# Patient Record
Sex: Male | Born: 1965 | Race: White | Hispanic: No | Marital: Single | Smoking: Never smoker
Health system: Southern US, Community
[De-identification: ages and names within clinical notes are randomized; demographics above are authoritative.]

## PROBLEM LIST (undated history)

## (undated) DIAGNOSIS — I639 Cerebral infarction, unspecified: Secondary | ICD-10-CM

## (undated) DIAGNOSIS — I272 Pulmonary hypertension, unspecified: Secondary | ICD-10-CM

## (undated) DIAGNOSIS — I509 Heart failure, unspecified: Secondary | ICD-10-CM

## (undated) DIAGNOSIS — M109 Gout, unspecified: Secondary | ICD-10-CM

## (undated) DIAGNOSIS — N183 Chronic kidney disease, stage 3 unspecified: Secondary | ICD-10-CM

## (undated) DIAGNOSIS — I129 Hypertensive chronic kidney disease with stage 1 through stage 4 chronic kidney disease, or unspecified chronic kidney disease: Secondary | ICD-10-CM

## (undated) DIAGNOSIS — M1990 Unspecified osteoarthritis, unspecified site: Secondary | ICD-10-CM

## (undated) DIAGNOSIS — I82409 Acute embolism and thrombosis of unspecified deep veins of unspecified lower extremity: Secondary | ICD-10-CM

## (undated) DIAGNOSIS — E669 Obesity, unspecified: Secondary | ICD-10-CM

## (undated) DIAGNOSIS — I2510 Atherosclerotic heart disease of native coronary artery without angina pectoris: Secondary | ICD-10-CM

## (undated) DIAGNOSIS — K219 Gastro-esophageal reflux disease without esophagitis: Secondary | ICD-10-CM

## (undated) DIAGNOSIS — M549 Dorsalgia, unspecified: Secondary | ICD-10-CM

## (undated) DIAGNOSIS — I43 Cardiomyopathy in diseases classified elsewhere: Secondary | ICD-10-CM

## (undated) DIAGNOSIS — E785 Hyperlipidemia, unspecified: Secondary | ICD-10-CM

## (undated) DIAGNOSIS — I119 Hypertensive heart disease without heart failure: Secondary | ICD-10-CM

## (undated) DIAGNOSIS — E119 Type 2 diabetes mellitus without complications: Secondary | ICD-10-CM

## (undated) HISTORY — DX: Obesity, unspecified: E66.9

## (undated) HISTORY — DX: Type 2 diabetes mellitus without complications: E11.9

## (undated) HISTORY — DX: Acute embolism and thrombosis of unspecified deep veins of unspecified lower extremity: I82.409

## (undated) HISTORY — PX: OTHER SURGICAL HISTORY: SHX169

## (undated) HISTORY — DX: Gastro-esophageal reflux disease without esophagitis: K21.9

## (undated) HISTORY — DX: Hypertensive chronic kidney disease with stage 1 through stage 4 chronic kidney disease, or unspecified chronic kidney disease: I12.9

## (undated) HISTORY — PX: MENISCECTOMY: SHX123

## (undated) HISTORY — DX: Cardiomyopathy in diseases classified elsewhere: I43

## (undated) HISTORY — DX: Heart failure, unspecified: I50.9

## (undated) HISTORY — PX: HERNIA REPAIR: SHX51

## (undated) HISTORY — DX: Hyperlipidemia, unspecified: E78.5

## (undated) HISTORY — DX: Atherosclerotic heart disease of native coronary artery without angina pectoris: I25.10

## (undated) HISTORY — DX: Chronic kidney disease, stage 3 (moderate): N18.3

## (undated) HISTORY — DX: Pulmonary hypertension, unspecified: I27.20

## (undated) HISTORY — DX: Chronic kidney disease, stage 3 unspecified: N18.30

## (undated) HISTORY — DX: Dorsalgia, unspecified: M54.9

## (undated) HISTORY — PX: CORONARY STENT PLACEMENT: SHX1402

## (undated) HISTORY — DX: Gout, unspecified: M10.9

## (undated) HISTORY — DX: Unspecified osteoarthritis, unspecified site: M19.90

## (undated) HISTORY — DX: Cerebral infarction, unspecified: I63.9

## (undated) HISTORY — DX: Hypertensive heart disease without heart failure: I11.9

---

## 2015-07-13 LAB — HEPATIC FUNCTION PANEL
ALT: 16 U/L (ref 10–40)
AST: 18 U/L (ref 14–40)
Alkaline Phosphatase: 93 U/L (ref 25–125)
BILIRUBIN, TOTAL: 0.2 mg/dL

## 2015-07-13 LAB — BASIC METABOLIC PANEL
BUN: 33 mg/dL — AB (ref 4–21)
CREATININE: 1.9 mg/dL — AB (ref 0.6–1.3)
Glucose: 153 mg/dL
POTASSIUM: 3.8 mmol/L (ref 3.4–5.3)
Sodium: 143 mmol/L (ref 137–147)

## 2015-07-13 LAB — CBC AND DIFFERENTIAL
HCT: 38 % — AB (ref 41–53)
HEMOGLOBIN: 11.9 g/dL — AB (ref 13.5–17.5)
Platelets: 371 10*3/uL (ref 150–399)
WBC: 8.3 10*3/mL

## 2015-07-13 LAB — POCT INR: INR: 1 (ref 0.9–1.1)

## 2015-07-13 LAB — PROTIME-INR: Protime: 10.5 seconds (ref 10.0–13.8)

## 2015-07-14 LAB — HEMOGLOBIN A1C: HEMOGLOBIN A1C: 6.6

## 2015-07-17 LAB — BASIC METABOLIC PANEL
BUN: 25 mg/dL — AB (ref 4–21)
Creatinine: 1.8 mg/dL — AB (ref 0.6–1.3)
POTASSIUM: 4 mmol/L (ref 3.4–5.3)
Sodium: 138 mmol/L (ref 137–147)

## 2015-07-17 LAB — CBC AND DIFFERENTIAL
HEMATOCRIT: 33 % — AB (ref 41–53)
HEMOGLOBIN: 10.8 g/dL — AB (ref 13.5–17.5)
PLATELETS: 434 10*3/uL — AB (ref 150–399)
WBC: 10.1 10^3/mL

## 2015-07-21 ENCOUNTER — Encounter: Payer: Self-pay | Admitting: Internal Medicine

## 2015-07-21 ENCOUNTER — Non-Acute Institutional Stay: Payer: BLUE CROSS/BLUE SHIELD | Admitting: Internal Medicine

## 2015-07-21 DIAGNOSIS — E785 Hyperlipidemia, unspecified: Secondary | ICD-10-CM

## 2015-07-21 DIAGNOSIS — E1122 Type 2 diabetes mellitus with diabetic chronic kidney disease: Secondary | ICD-10-CM | POA: Diagnosis not present

## 2015-07-21 DIAGNOSIS — I6340 Cerebral infarction due to embolism of unspecified cerebral artery: Secondary | ICD-10-CM | POA: Diagnosis not present

## 2015-07-21 DIAGNOSIS — I110 Hypertensive heart disease with heart failure: Secondary | ICD-10-CM | POA: Diagnosis not present

## 2015-07-21 DIAGNOSIS — I825Y9 Chronic embolism and thrombosis of unspecified deep veins of unspecified proximal lower extremity: Secondary | ICD-10-CM | POA: Diagnosis not present

## 2015-07-21 DIAGNOSIS — M1A49X0 Other secondary chronic gout, multiple sites, without tophus (tophi): Secondary | ICD-10-CM

## 2015-07-21 DIAGNOSIS — M1049 Other secondary gout, multiple sites: Secondary | ICD-10-CM

## 2015-07-21 DIAGNOSIS — I43 Cardiomyopathy in diseases classified elsewhere: Secondary | ICD-10-CM

## 2015-07-21 DIAGNOSIS — I129 Hypertensive chronic kidney disease with stage 1 through stage 4 chronic kidney disease, or unspecified chronic kidney disease: Secondary | ICD-10-CM

## 2015-07-21 DIAGNOSIS — I272 Other secondary pulmonary hypertension: Secondary | ICD-10-CM

## 2015-07-21 DIAGNOSIS — K921 Melena: Secondary | ICD-10-CM

## 2015-07-21 DIAGNOSIS — N183 Chronic kidney disease, stage 3 unspecified: Secondary | ICD-10-CM

## 2015-07-21 NOTE — Progress Notes (Signed)
Patient ID: Jimmy Tran Pfahler, male   DOB: 10/29/1965, 50 y.o.   MRN: [id number] MRN: [id number] Name: Jimmy Tran Stumpp  Sex: male Age: 50 y.o. DOB: 03/04/1966  [facility] Facility/Room:119 Level Of Care: SNF Provider: Merrilee SeashoreAnne Giulia Hickey Emergency Contacts: Extended Emergency Contact Information Primary Emergency Contact: Aloha Gellurner,Jerry  [place] Home Phone: [phone] Relation: Brother Secondary Emergency Contact: Dorris FetchScott,Rhonda  [place] Home Phone: [phone] Relation: Sister  Code Status:   Allergies: Beef allergy; Fish-derived products; Milk-related compounds; Pork-derived products; Shellfish-derived products; and Shrimp  Chief Complaint  Patient presents with  . New Admit To SNF    Admission to Nursing Facility    HPI: Patient is 50 y.o. male who prior CVA with R side weakness with high risk social circumstances who presented to ED with blood in stool. He was [facility] basically for SNF placement but but his non compliance with medical tx presenting hazards to his health were addressed including but not limited to blood in stool and cardiomyopathy and pulmonary HTN as outlined below.Pt is admitted to SNF for residential care. While at SNF pt will ne followed for HTN, tx with norvasc, coreg and lisinopril, DM, tx with amaryl and tradjenta and HLD, tx with lipitor.  Past Medical History  Diagnosis Date  . Arthritis   . Back pain   . CHF (congestive heart failure) (HCC)   . Coronary artery disease     2 stents 2004  . Gout   . Hyperlipemia   . DVT (deep venous thrombosis) (HCC)   . CVA (cerebral infarction)     Residual Right Hemiparesis  . DM type 2 (diabetes mellitus, type 2) (HCC)   . Cardiomyopathy due to hypertension (HCC)   . GERD (gastroesophageal reflux disease)   . Pulmonary hypertension (HCC)   . Obesity, Class I, BMI 30.0-34.9 (see actual BMI)   . Benign hypertension with CKD (chronic kidney disease) stage III     Past  Surgical History  Procedure Laterality Date  . Coronary stent placement      2004  . Hernia repair    . Left knee surgery    . Meniscectomy Left     2004      Medication List       This list is accurate as of: 07/21/15 11:59 PM.  Always use your most recent med list.               allopurinol 300 MG tablet  Commonly known as:  ZYLOPRIM  Take 300 mg by mouth daily.     amLODipine 10 MG tablet  Commonly known as:  NORVASC  Take 10 mg by mouth daily.     apixaban 5 MG Tabs tablet  Commonly known as:  ELIQUIS  Take 5 mg by mouth 2 (two) times daily.     atorvastatin 20 MG tablet  Commonly known as:  LIPITOR  Take 20 mg by mouth daily.     carvedilol 25 MG tablet  Commonly known as:  COREG  Take 12.5 mg by mouth 2 (two) times daily with a meal.     chlorthalidone 25 MG tablet  Commonly known as:  HYGROTON  Take 25 mg by mouth daily.     colchicine 0.6 MG tablet  Take 0.6 mg by mouth every other day.     furosemide 20 MG tablet  Commonly known as:  LASIX  Take 20 mg by mouth.     glimepiride 1 MG tablet  Commonly  known as:  AMARYL  Take 1 mg by mouth daily with breakfast.     lisinopril 20 MG tablet  Commonly known as:  PRINIVIL,ZESTRIL  Take 20 mg by mouth daily.     mirtazapine 15 MG tablet  Commonly known as:  REMERON  Take 15 mg by mouth at bedtime.     multivitamin tablet  Take 1 tablet by mouth daily.     TRADJENTA 5 MG Tabs tablet  Generic drug:  linagliptin  Take 5 mg by mouth daily with breakfast.        Meds ordered this encounter  Medications  . allopurinol (ZYLOPRIM) 300 MG tablet    Sig: Take 300 mg by mouth daily.  Marland Kitchen amLODipine (NORVASC) 10 MG tablet    Sig: Take 10 mg by mouth daily.  Marland Kitchen apixaban (ELIQUIS) 5 MG TABS tablet    Sig: Take 5 mg by mouth 2 (two) times daily.  . chlorthalidone (HYGROTON) 25 MG tablet    Sig: Take 25 mg by mouth daily.  . colchicine 0.6 MG tablet    Sig: Take 0.6 mg by mouth every other day.  Marland Kitchen  glimepiride (AMARYL) 1 MG tablet    Sig: Take 1 mg by mouth daily with breakfast.  . lisinopril (PRINIVIL,ZESTRIL) 20 MG tablet    Sig: Take 20 mg by mouth daily.  . mirtazapine (REMERON) 15 MG tablet    Sig: Take 15 mg by mouth at bedtime.  . Multiple Vitamin (MULTIVITAMIN) tablet    Sig: Take 1 tablet by mouth daily.  Marland Kitchen atorvastatin (LIPITOR) 20 MG tablet    Sig: Take 20 mg by mouth daily.  . carvedilol (COREG) 25 MG tablet    Sig: Take 12.5 mg by mouth 2 (two) times daily with a meal.  . furosemide (LASIX) 20 MG tablet    Sig: Take 20 mg by mouth.  . linagliptin (TRADJENTA) 5 MG TABS tablet    Sig: Take 5 mg by mouth daily with breakfast.     There is no immunization history on file for this patient.  Social History  Substance Use Topics  . Smoking status: Never Smoker   . Smokeless tobacco: Never Used  . Alcohol Use: Not on file    Family history is + HIV, DM, HTN  Review of Systems  DATA OBTAINED: from patient, nurse GENERAL:  no fevers, fatigue, appetite changes SKIN: No itching, rash or wounds EYES: No eye pain, redness, discharge EARS: No earache, tinnitus, change in hearing NOSE: No congestion, drainage or bleeding  MOUTH/THROAT: No mouth or tooth pain, No sore throat RESPIRATORY: No cough, wheezing, SOB CARDIAC: No chest pain, palpitations, lower extremity edema  GI: No abdominal pain, No N/V/D or constipation, No heartburn or reflux  GU: No dysuria, frequency or urgency, or incontinence  MUSCULOSKELETAL: No unrelieved bone/joint pain NEUROLOGIC: No headache, dizziness or focal weakness PSYCHIATRIC: No c/o anxiety or sadness   Filed Vitals:   07/21/15 1156  BP: 136/90  Pulse: 95  Temp: 96.5 F (35.8 C)  Resp: 20    SpO2 Readings from Last 1 Encounters:  07/21/15 99%        Physical Exam  GENERAL APPEARANCE: Alert, conversant,  No acute distress.  SKIN: No diaphoresis rash HEAD: Normocephalic, atraumatic  EYES: Conjunctiva/lids clear.  Pupils round, reactive. EOMs intact.  EARS: External exam WNL, canals clear. Hearing grossly normal.  NOSE: No deformity or discharge.  MOUTH/THROAT: Lips w/o lesions  RESPIRATORY: Breathing is even, unlabored. Lung sounds are  clear   CARDIOVASCULAR: Heart RRR no murmurs, rubs or gallops. No peripheral edema.   GASTROINTESTINAL: Abdomen is soft, non-tender, not distended w/ normal bowel sounds. GENITOURINARY: Bladder non tender, not distended  MUSCULOSKELETAL: No abnormal joints or musculature NEUROLOGIC:  Cranial nerves 2-12 grossly intact, R hemiparesis PSYCHIATRIC: Mood and affect appropriate to situation, no behavioral issues  Patient Active Problem List   Diagnosis Date Noted  . DM type 2 (diabetes mellitus, type 2) (HCC) 08/10/2015  . Hyperlipemia 08/10/2015  . DVT (deep venous thrombosis) (HCC) 08/10/2015  . Benign hypertension with CKD (chronic kidney disease) stage III 08/10/2015  . Pulmonary hypertension (HCC) 08/10/2015  . Gout 08/10/2015  . Blood in stool 08/07/2015  . CVA (cerebral infarction) 08/07/2015  . Cardiomyopathy due to hypertension (HCC) 08/07/2015  . Hypertensive heart disease with CHF (congestive heart failure) (HCC) 08/07/2015    CBC    Component Value Date/Time   WBC 10.1 07/17/2015   HGB 10.8* 07/17/2015   HCT 33* 07/17/2015   PLT 434* 07/17/2015    CMP     Component Value Date/Time   NA 138 07/17/2015   K 4.0 07/17/2015   BUN 25* 07/17/2015   CREATININE 1.8* 07/17/2015   AST 18 07/13/2015   ALT 16 07/13/2015   ALKPHOS 93 07/13/2015    Lab Results  Component Value Date   HGBA1C 6.6 07/14/2015     Patient was never admitted.  Not all labs, radiology exams or other studies done during hospitalization come through on my EPIC note; however they are reviewed by me.    Assessment and Plan  Blood in stool Felt to be from ASA alone or ASA and eliquis combned; ASA was stopped and Hb remained stable and 10.1 ar d/c; SNF - will f/u  CBC  CVA (cerebral infarction) SNF - admitted for OT/PT   Cardiomyopathy due to hypertension (HCC) SNF - h/o non compliance; pt restarted on coreg, lasix and lisinopril  Hypertensive heart disease with CHF (congestive heart failure) (HCC) SNF - controlled ; cont norvasc 10 mg, lisinopril 10 mg, lopressor 25 mg BID  DM type 2 (diabetes mellitus, type 2) (HCC) SNF - A1c was 6.6; good control; cont amaryl 1 mg and tradjenta 5 mg daily  Hyperlipemia Not stated as uncontrolled; cont lipitor  DVT (deep venous thrombosis) (HCC) SNF - with blood in stool, ASA was d/c but eliquis 5 mg BID was continued  Benign hypertension with CKD (chronic kidney disease) stage III SNF - Cr at baseline at around 2; will f/i BMP  Pulmonary hypertension (HCC) SNF - not stated as unstable;cont lasix and coreg  Gout SNF - not stated as uncontrolled ;cont allopurinol and colchicine   Time spent > 45 min;> 50% of time with patient was spent reviewing records, labs, tests and studies, counseling and developing plan of care  Merrilee Seashore, MD

## 2015-07-21 NOTE — Progress Notes (Signed)
This encounter was created in error - please disregard.

## 2015-08-07 ENCOUNTER — Encounter: Payer: Self-pay | Admitting: Internal Medicine

## 2015-08-07 DIAGNOSIS — K921 Melena: Secondary | ICD-10-CM | POA: Insufficient documentation

## 2015-08-07 DIAGNOSIS — I639 Cerebral infarction, unspecified: Secondary | ICD-10-CM | POA: Insufficient documentation

## 2015-08-07 DIAGNOSIS — I43 Cardiomyopathy in diseases classified elsewhere: Secondary | ICD-10-CM

## 2015-08-07 DIAGNOSIS — I110 Hypertensive heart disease with heart failure: Secondary | ICD-10-CM | POA: Insufficient documentation

## 2015-08-07 DIAGNOSIS — I119 Hypertensive heart disease without heart failure: Secondary | ICD-10-CM | POA: Insufficient documentation

## 2015-08-07 NOTE — Progress Notes (Signed)
This encounter was created in error - please disregard.

## 2015-08-07 NOTE — Assessment & Plan Note (Signed)
Felt to be from ASA alone or ASA and eliquis combned; ASA was stopped and Hb remained stable and 10.1 ar d/c; SNF - will f/u CBC

## 2015-08-07 NOTE — Assessment & Plan Note (Signed)
SNF - h/o non compliance; pt restarted on coreg, lasix and lisinopril

## 2015-08-07 NOTE — Assessment & Plan Note (Signed)
SNF - admitted for OT/PT 

## 2015-08-07 NOTE — Assessment & Plan Note (Signed)
SNF - controlled ; cont norvasc 10 mg, lisinopril 10 mg, lopressor 25 mg BID

## 2015-08-10 DIAGNOSIS — I129 Hypertensive chronic kidney disease with stage 1 through stage 4 chronic kidney disease, or unspecified chronic kidney disease: Secondary | ICD-10-CM | POA: Insufficient documentation

## 2015-08-10 DIAGNOSIS — N183 Chronic kidney disease, stage 3 (moderate): Secondary | ICD-10-CM

## 2015-08-10 DIAGNOSIS — I272 Pulmonary hypertension, unspecified: Secondary | ICD-10-CM | POA: Insufficient documentation

## 2015-08-10 DIAGNOSIS — I82409 Acute embolism and thrombosis of unspecified deep veins of unspecified lower extremity: Secondary | ICD-10-CM | POA: Insufficient documentation

## 2015-08-10 DIAGNOSIS — M109 Gout, unspecified: Secondary | ICD-10-CM | POA: Insufficient documentation

## 2015-08-10 DIAGNOSIS — E785 Hyperlipidemia, unspecified: Secondary | ICD-10-CM | POA: Insufficient documentation

## 2015-08-10 DIAGNOSIS — E119 Type 2 diabetes mellitus without complications: Secondary | ICD-10-CM | POA: Insufficient documentation

## 2015-08-10 NOTE — Assessment & Plan Note (Signed)
SNF - not stated as uncontrolled ;cont allopurinol and colchicine

## 2015-08-10 NOTE — Assessment & Plan Note (Signed)
SNF - Cr at baseline at around 2; will f/i BMP

## 2015-08-10 NOTE — Assessment & Plan Note (Signed)
SNF - not stated as unstable;cont lasix and coreg

## 2015-08-10 NOTE — Assessment & Plan Note (Signed)
SNF - with blood in stool, ASA was d/c but eliquis 5 mg BID was continued

## 2015-08-10 NOTE — Assessment & Plan Note (Signed)
Not stated as uncontrolled; cont lipitor

## 2015-08-10 NOTE — Assessment & Plan Note (Signed)
SNF - A1c was 6.6; good control; cont amaryl 1 mg and tradjenta 5 mg daily

## 2015-08-14 ENCOUNTER — Encounter: Payer: Self-pay | Admitting: Internal Medicine

## 2015-08-14 ENCOUNTER — Non-Acute Institutional Stay: Payer: BLUE CROSS/BLUE SHIELD | Admitting: Internal Medicine

## 2015-08-14 DIAGNOSIS — E1122 Type 2 diabetes mellitus with diabetic chronic kidney disease: Secondary | ICD-10-CM

## 2015-08-14 DIAGNOSIS — I825Y9 Chronic embolism and thrombosis of unspecified deep veins of unspecified proximal lower extremity: Secondary | ICD-10-CM

## 2015-08-14 DIAGNOSIS — E785 Hyperlipidemia, unspecified: Secondary | ICD-10-CM | POA: Diagnosis not present

## 2015-08-14 DIAGNOSIS — M1049 Other secondary gout, multiple sites: Secondary | ICD-10-CM

## 2015-08-14 DIAGNOSIS — I6340 Cerebral infarction due to embolism of unspecified cerebral artery: Secondary | ICD-10-CM

## 2015-08-14 DIAGNOSIS — M1A49X0 Other secondary chronic gout, multiple sites, without tophus (tophi): Secondary | ICD-10-CM

## 2015-08-14 DIAGNOSIS — I272 Other secondary pulmonary hypertension: Secondary | ICD-10-CM

## 2015-08-14 DIAGNOSIS — I110 Hypertensive heart disease with heart failure: Secondary | ICD-10-CM | POA: Diagnosis not present

## 2015-08-14 DIAGNOSIS — N183 Chronic kidney disease, stage 3 unspecified: Secondary | ICD-10-CM

## 2015-08-14 DIAGNOSIS — K921 Melena: Secondary | ICD-10-CM | POA: Diagnosis not present

## 2015-08-14 DIAGNOSIS — I129 Hypertensive chronic kidney disease with stage 1 through stage 4 chronic kidney disease, or unspecified chronic kidney disease: Secondary | ICD-10-CM | POA: Diagnosis not present

## 2015-08-14 DIAGNOSIS — I43 Cardiomyopathy in diseases classified elsewhere: Secondary | ICD-10-CM

## 2015-08-14 NOTE — Progress Notes (Signed)
MRN: [id number] Name: Jimmy Tran  Sex: male Age: 50 y.o. DOB: 02/15/1966  [facility] Facility/Room:126 Level Of Care: SNF Provider: Merrilee SeashoreALEXANDER, ANNE D Emergency Contacts: Extended Emergency Contact Information Primary Emergency Contact: Aloha Gellurner,Jerry  [place] Home Phone: [phone] Relation: Brother Secondary Emergency Contact: Dorris FetchScott,Rhonda  [place] Home Phone: [phone] Relation: Sister  Code Status:   Allergies: Beef allergy; Fish-derived products; Milk-related compounds; Pork-derived products; Shellfish-derived products; and Shrimp  Chief Complaint  Patient presents with  . Discharge Note    HPI: Patient is 50 y.o. male with prior CVA with R side weakness with high risk social circumstances who presented to ED with blood in stool. [facility] was [facility] basically for SNF placement but but his non compliance with medical tx presenting hazards to his health were addressed including but not limited to blood in stool and cardiomyopathy and pulmonary HTN as outlined below.Pt was admitted to SNF for residential care. Pt is now being transferred to the [facility].  Past Medical History  Diagnosis Date  . Arthritis   . Back pain   . CHF (congestive heart failure) (HCC)   . Coronary artery disease     2 stents 2004  . Gout   . Hyperlipemia   . DVT (deep venous thrombosis) (HCC)   . CVA (cerebral infarction)     Residual Right Hemiparesis  . DM type 2 (diabetes mellitus, type 2) (HCC)   . Cardiomyopathy due to hypertension (HCC)   . GERD (gastroesophageal reflux disease)   . Pulmonary hypertension (HCC)   . Obesity, Class I, BMI 30.0-34.9 (see actual BMI)   . Benign hypertension with CKD (chronic kidney disease) stage III     Past Surgical History  Procedure Laterality Date  . Coronary stent placement      2004  . Hernia repair    . Left knee surgery    . Meniscectomy Left     2004      Medication List       This  list is accurate as of: 08/14/15  4:22 PM.  Always use your most recent med list.               allopurinol 300 MG tablet  Commonly known as:  ZYLOPRIM  Take 300 mg by mouth daily.     amLODipine 10 MG tablet  Commonly known as:  NORVASC  Take 10 mg by mouth daily.     apixaban 5 MG Tabs tablet  Commonly known as:  ELIQUIS  Take 5 mg by mouth 2 (two) times daily.     atorvastatin 20 MG tablet  Commonly known as:  LIPITOR  Take 20 mg by mouth daily.     carvedilol 25 MG tablet  Commonly known as:  COREG  Take 12.5 mg by mouth 2 (two) times daily with a meal.     colchicine 0.6 MG tablet  Take 0.6 mg by mouth every other day.     furosemide 20 MG tablet  Commonly known as:  LASIX  Take 20 mg by mouth.     glimepiride 1 MG tablet  Commonly known as:  AMARYL  Take 1 mg by mouth daily with breakfast.     lisinopril 20 MG tablet  Commonly known as:  PRINIVIL,ZESTRIL  Take 20 mg by mouth daily.     mirtazapine 15 MG tablet  Commonly known as:  REMERON  Take 15 mg by mouth at bedtime.  multivitamin tablet  Take 1 tablet by mouth daily.     TRADJENTA 5 MG Tabs tablet  Generic drug:  linagliptin  Take 5 mg by mouth daily with breakfast.        No orders of the defined types were placed in this encounter.     There is no immunization history on file for this patient.  Social History  Substance Use Topics  . Smoking status: Never Smoker   . Smokeless tobacco: Never Used  . Alcohol Use: Not on file    Filed Vitals:   08/14/15 1559  BP: 136/90  Pulse: 95  Temp: 96.5 F (35.8 C)  Resp: 20    Physical Exam  GENERAL APPEARANCE: Alert, conversant. No acute distress.  HEENT: Unremarkable. RESPIRATORY: Breathing is even, unlabored. Lung sounds are clear   CARDIOVASCULAR: Heart RRR no murmurs, rubs or gallops. No peripheral edema.  GASTROINTESTINAL: Abdomen is soft, non-tender, not distended w/ normal bowel sounds.  NEUROLOGIC: Cranial nerves 2-12  grossly intact; R hemiparesis  Patient Active Problem List   Diagnosis Date Noted  . DM type 2 (diabetes mellitus, type 2) (HCC) 08/10/2015  . Hyperlipemia 08/10/2015  . DVT (deep venous thrombosis) (HCC) 08/10/2015  . Benign hypertension with CKD (chronic kidney disease) stage III 08/10/2015  . Pulmonary hypertension (HCC) 08/10/2015  . Gout 08/10/2015  . Blood in stool 08/07/2015  . CVA (cerebral infarction) 08/07/2015  . Cardiomyopathy due to hypertension (HCC) 08/07/2015  . Hypertensive heart disease with CHF (congestive heart failure) (HCC) 08/07/2015    CBC    Component Value Date/Time   WBC 10.1 07/17/2015   HGB 10.8* 07/17/2015   HCT 33* 07/17/2015   PLT 434* 07/17/2015    CMP     Component Value Date/Time   NA 138 07/17/2015   K 4.0 07/17/2015   BUN 25* 07/17/2015   CREATININE 1.8* 07/17/2015   AST 18 07/13/2015   ALT 16 07/13/2015   ALKPHOS 93 07/13/2015    Assessment and Plan  Pt is being transferred to the [place] center. Medications have been reconciled.   Time spent > 45 min;> 50% of time with patient was spent reviewing records, labs, tests and studies, counseling and developing plan of care  Margit Hanks, MD

## 2017-02-25 ENCOUNTER — Inpatient Hospital Stay
Admission: AD | Admit: 2017-02-25 | Discharge: 2017-03-30 | Disposition: A | Discharge status: Skilled Nursing Facility | Payer: Self-pay | Source: Ambulatory Visit | Attending: Internal Medicine | Admitting: Internal Medicine

## 2017-02-25 ENCOUNTER — Other Ambulatory Visit (HOSPITAL_COMMUNITY): Payer: Self-pay

## 2017-02-25 DIAGNOSIS — R509 Fever, unspecified: Secondary | ICD-10-CM

## 2017-02-25 DIAGNOSIS — Z452 Encounter for adjustment and management of vascular access device: Secondary | ICD-10-CM

## 2017-02-25 DIAGNOSIS — Z0189 Encounter for other specified special examinations: Secondary | ICD-10-CM

## 2017-02-25 DIAGNOSIS — Z431 Encounter for attention to gastrostomy: Secondary | ICD-10-CM

## 2017-02-25 DIAGNOSIS — N179 Acute kidney failure, unspecified: Secondary | ICD-10-CM

## 2017-02-25 DIAGNOSIS — Z4659 Encounter for fitting and adjustment of other gastrointestinal appliance and device: Secondary | ICD-10-CM

## 2017-02-25 DIAGNOSIS — Z931 Gastrostomy status: Secondary | ICD-10-CM

## 2017-02-26 ENCOUNTER — Other Ambulatory Visit (HOSPITAL_COMMUNITY): Payer: Self-pay

## 2017-02-26 LAB — URINALYSIS, ROUTINE W REFLEX MICROSCOPIC
Bilirubin Urine: NEGATIVE
Glucose, UA: NEGATIVE mg/dL
Hgb urine dipstick: NEGATIVE
KETONES UR: NEGATIVE mg/dL
NITRITE: POSITIVE — AB
PH: 5 (ref 5.0–8.0)
Protein, ur: 30 mg/dL — AB
Specific Gravity, Urine: 1.01 (ref 1.005–1.030)
Squamous Epithelial / LPF: NONE SEEN

## 2017-02-26 LAB — COMPREHENSIVE METABOLIC PANEL
ALBUMIN: 1.5 g/dL — AB (ref 3.5–5.0)
ALK PHOS: 200 U/L — AB (ref 38–126)
ALT: 11 U/L — AB (ref 17–63)
ANION GAP: 9 (ref 5–15)
AST: 14 U/L — ABNORMAL LOW (ref 15–41)
BILIRUBIN TOTAL: 0.8 mg/dL (ref 0.3–1.2)
BUN: 21 mg/dL — ABNORMAL HIGH (ref 6–20)
CO2: 22 mmol/L (ref 22–32)
Calcium: 8.2 mg/dL — ABNORMAL LOW (ref 8.9–10.3)
Chloride: 104 mmol/L (ref 101–111)
Creatinine, Ser: 2.58 mg/dL — ABNORMAL HIGH (ref 0.61–1.24)
GFR calc Af Amer: 32 mL/min — ABNORMAL LOW (ref >60)
GFR calc non Af Amer: 27 mL/min — ABNORMAL LOW (ref >60)
Glucose, Bld: 101 mg/dL — ABNORMAL HIGH (ref 65–99)
Potassium: 5.9 mmol/L — ABNORMAL HIGH (ref 3.5–5.1)
SODIUM: 135 mmol/L (ref 135–145)
Total Protein: 6.6 g/dL (ref 6.5–8.1)

## 2017-02-26 LAB — CBC WITH DIFFERENTIAL/PLATELET
BASOS ABS: 0 10*3/uL (ref 0.0–0.1)
BASOS PCT: 0 %
EOS ABS: 0.3 10*3/uL (ref 0.0–0.7)
EOS PCT: 3 %
HCT: 24.7 % — ABNORMAL LOW (ref 39.0–52.0)
Hemoglobin: 7.5 g/dL — ABNORMAL LOW (ref 13.0–17.0)
LYMPHS ABS: 3.2 10*3/uL (ref 0.7–4.0)
Lymphocytes Relative: 26 %
MCH: 27.8 pg (ref 26.0–34.0)
MCHC: 30.4 g/dL (ref 30.0–36.0)
MCV: 91.5 fL (ref 78.0–100.0)
Monocytes Absolute: 1.2 10*3/uL — ABNORMAL HIGH (ref 0.1–1.0)
Monocytes Relative: 9 %
Neutro Abs: 7.9 10*3/uL — ABNORMAL HIGH (ref 1.7–7.7)
Neutrophils Relative %: 62 %
PLATELETS: 726 10*3/uL — AB (ref 150–400)
RBC: 2.7 MIL/uL — AB (ref 4.22–5.81)
RDW: 14.8 % (ref 11.5–15.5)
WBC: 12.6 10*3/uL — AB (ref 4.0–10.5)

## 2017-02-26 LAB — HEMOGLOBIN A1C
HEMOGLOBIN A1C: 5.4 % (ref 4.8–5.6)
MEAN PLASMA GLUCOSE: 108.28 mg/dL

## 2017-02-26 LAB — PHOSPHORUS: PHOSPHORUS: 5.9 mg/dL — AB (ref 2.5–4.6)

## 2017-02-26 LAB — MAGNESIUM: Magnesium: 1.6 mg/dL — ABNORMAL LOW (ref 1.7–2.4)

## 2017-02-26 LAB — CK: Total CK: 20 U/L — ABNORMAL LOW (ref 49–397)

## 2017-02-26 LAB — PROTIME-INR
INR: 2.3
PROTHROMBIN TIME: 25.1 s — AB (ref 11.4–15.2)

## 2017-02-26 LAB — TSH: TSH: 0.943 u[IU]/mL (ref 0.350–4.500)

## 2017-02-27 LAB — RENAL FUNCTION PANEL
ANION GAP: 6 (ref 5–15)
Albumin: 1.5 g/dL — ABNORMAL LOW (ref 3.5–5.0)
BUN: 24 mg/dL — ABNORMAL HIGH (ref 6–20)
CALCIUM: 8 mg/dL — AB (ref 8.9–10.3)
CHLORIDE: 109 mmol/L (ref 101–111)
CO2: 21 mmol/L — AB (ref 22–32)
Creatinine, Ser: 2.78 mg/dL — ABNORMAL HIGH (ref 0.61–1.24)
GFR calc Af Amer: 29 mL/min — ABNORMAL LOW (ref >60)
GFR calc non Af Amer: 25 mL/min — ABNORMAL LOW (ref >60)
GLUCOSE: 108 mg/dL — AB (ref 65–99)
Phosphorus: 5.9 mg/dL — ABNORMAL HIGH (ref 2.5–4.6)
Potassium: 5 mmol/L (ref 3.5–5.1)
SODIUM: 136 mmol/L (ref 135–145)

## 2017-02-27 LAB — CBC
HCT: 24.3 % — ABNORMAL LOW (ref 39.0–52.0)
HEMOGLOBIN: 7.5 g/dL — AB (ref 13.0–17.0)
MCH: 28.1 pg (ref 26.0–34.0)
MCHC: 30.9 g/dL (ref 30.0–36.0)
MCV: 91 fL (ref 78.0–100.0)
Platelets: 695 10*3/uL — ABNORMAL HIGH (ref 150–400)
RBC: 2.67 MIL/uL — ABNORMAL LOW (ref 4.22–5.81)
RDW: 14.7 % (ref 11.5–15.5)
WBC: 9.5 10*3/uL (ref 4.0–10.5)

## 2017-02-27 LAB — URINE CULTURE: Culture: >=100000 — AB

## 2017-02-27 LAB — MAGNESIUM: Magnesium: 1.6 mg/dL — ABNORMAL LOW (ref 1.7–2.4)

## 2017-02-27 LAB — PHOSPHORUS: Phosphorus: 5.9 mg/dL — ABNORMAL HIGH (ref 2.5–4.6)

## 2017-02-28 ENCOUNTER — Other Ambulatory Visit (HOSPITAL_COMMUNITY): Payer: Self-pay

## 2017-02-28 LAB — RENAL FUNCTION PANEL
Albumin: 1.5 g/dL — ABNORMAL LOW (ref 3.5–5.0)
Anion gap: 8 (ref 5–15)
BUN: 28 mg/dL — ABNORMAL HIGH (ref 6–20)
CALCIUM: 8.1 mg/dL — AB (ref 8.9–10.3)
CHLORIDE: 110 mmol/L (ref 101–111)
CO2: 22 mmol/L (ref 22–32)
CREATININE: 2.74 mg/dL — AB (ref 0.61–1.24)
GFR calc non Af Amer: 25 mL/min — ABNORMAL LOW (ref >60)
GFR, EST AFRICAN AMERICAN: 29 mL/min — AB (ref >60)
GLUCOSE: 101 mg/dL — AB (ref 65–99)
Phosphorus: 5 mg/dL — ABNORMAL HIGH (ref 2.5–4.6)
Potassium: 4.4 mmol/L (ref 3.5–5.1)
SODIUM: 140 mmol/L (ref 135–145)

## 2017-02-28 LAB — CBC
HCT: 25 % — ABNORMAL LOW (ref 39.0–52.0)
HEMOGLOBIN: 7.4 g/dL — AB (ref 13.0–17.0)
MCH: 27.2 pg (ref 26.0–34.0)
MCHC: 29.6 g/dL — AB (ref 30.0–36.0)
MCV: 91.9 fL (ref 78.0–100.0)
Platelets: 665 10*3/uL — ABNORMAL HIGH (ref 150–400)
RBC: 2.72 MIL/uL — ABNORMAL LOW (ref 4.22–5.81)
RDW: 15.1 % (ref 11.5–15.5)
WBC: 9.4 10*3/uL (ref 4.0–10.5)

## 2017-02-28 LAB — MAGNESIUM: MAGNESIUM: 1.6 mg/dL — AB (ref 1.7–2.4)

## 2017-03-01 ENCOUNTER — Other Ambulatory Visit (HOSPITAL_COMMUNITY): Payer: Self-pay

## 2017-03-01 LAB — CREATININE, URINE, RANDOM: CREATININE, URINE: 67.94 mg/dL

## 2017-03-01 LAB — SODIUM, URINE, RANDOM: SODIUM UR: 72 mmol/L

## 2017-03-02 LAB — UREA NITROGEN, URINE: Urea Nitrogen, Ur: 283 mg/dL

## 2017-03-02 LAB — EOSINOPHIL COUNT: Eosinophils Absolute: 0.5 10*3/uL (ref 0.0–0.7)

## 2017-03-04 LAB — CBC
HCT: 23.2 % — ABNORMAL LOW (ref 39.0–52.0)
Hemoglobin: 7 g/dL — ABNORMAL LOW (ref 13.0–17.0)
MCH: 27.3 pg (ref 26.0–34.0)
MCHC: 30.2 g/dL (ref 30.0–36.0)
MCV: 90.6 fL (ref 78.0–100.0)
PLATELETS: 491 10*3/uL — AB (ref 150–400)
RBC: 2.56 MIL/uL — ABNORMAL LOW (ref 4.22–5.81)
RDW: 15 % (ref 11.5–15.5)
WBC: 11.4 10*3/uL — ABNORMAL HIGH (ref 4.0–10.5)

## 2017-03-04 LAB — BASIC METABOLIC PANEL
Anion gap: 7 (ref 5–15)
BUN: 55 mg/dL — AB (ref 6–20)
CHLORIDE: 107 mmol/L (ref 101–111)
CO2: 26 mmol/L (ref 22–32)
CREATININE: 2.53 mg/dL — AB (ref 0.61–1.24)
Calcium: 8.4 mg/dL — ABNORMAL LOW (ref 8.9–10.3)
GFR calc Af Amer: 32 mL/min — ABNORMAL LOW (ref >60)
GFR calc non Af Amer: 28 mL/min — ABNORMAL LOW (ref >60)
Glucose, Bld: 105 mg/dL — ABNORMAL HIGH (ref 65–99)
Potassium: 3.8 mmol/L (ref 3.5–5.1)
Sodium: 140 mmol/L (ref 135–145)

## 2017-03-06 LAB — BASIC METABOLIC PANEL
Anion gap: 11 (ref 5–15)
BUN: 80 mg/dL — AB (ref 6–20)
CALCIUM: 8.4 mg/dL — AB (ref 8.9–10.3)
CO2: 24 mmol/L (ref 22–32)
Chloride: 99 mmol/L — ABNORMAL LOW (ref 101–111)
Creatinine, Ser: 3.02 mg/dL — ABNORMAL HIGH (ref 0.61–1.24)
GFR calc Af Amer: 26 mL/min — ABNORMAL LOW (ref >60)
GFR, EST NON AFRICAN AMERICAN: 23 mL/min — AB (ref >60)
GLUCOSE: 101 mg/dL — AB (ref 65–99)
Potassium: 4.2 mmol/L (ref 3.5–5.1)
Sodium: 134 mmol/L — ABNORMAL LOW (ref 135–145)

## 2017-03-07 ENCOUNTER — Other Ambulatory Visit (HOSPITAL_COMMUNITY): Payer: Self-pay

## 2017-03-07 LAB — URINALYSIS, ROUTINE W REFLEX MICROSCOPIC
Bilirubin Urine: NEGATIVE
Glucose, UA: NEGATIVE mg/dL
Hgb urine dipstick: NEGATIVE
Ketones, ur: NEGATIVE mg/dL
Nitrite: NEGATIVE
Protein, ur: NEGATIVE mg/dL
SPECIFIC GRAVITY, URINE: 1.012 (ref 1.005–1.030)
SQUAMOUS EPITHELIAL / LPF: NONE SEEN
pH: 5 (ref 5.0–8.0)

## 2017-03-07 LAB — MAGNESIUM: MAGNESIUM: 2 mg/dL (ref 1.7–2.4)

## 2017-03-07 LAB — PHOSPHORUS: PHOSPHORUS: 3.1 mg/dL (ref 2.5–4.6)

## 2017-03-07 NOTE — Consult Note (Signed)
[facility] CONSULT NOTE    Date: 03/07/2017                  Patient Name:  Jimmy Tran  MRN: [id number]  DOB: 03/12/1966  Age / Sex: 51 y.o., male         PCP: Patient, No Pcp Per                 Service Requesting Consult: Hospitalist                 Reason for Consult: Acute renal failure            History of Present Illness: Patient is a 51 y.o. male with a PMHx of septic shock, functional quadriplegia, Clostridium difficile colitis, MRSA bacteremia, enterococcal bacteremia, right lower lobe ammonia secondary to Pseudomonas hypertension, recent acute respiratory failure, chronic anticoagulation, history of CVA, diabetes mellitus type 2, cardiomyopathy, pulmonary hypertension, chronic kidney disease stage III, gout, who was admitted to [facility] on 02/25/2017 for ongoing management. He was [facility] from 02/09/2017 to 02/25/2017.  [facility] was [facility] for presumed septic shock. He underwent intubation early in the course of his hospitalization. While in the intensive care unit he required vasopressors and broad-spectrum antibiotics for severe sepsis. Blood cultures demonstrated MRSA as well as enterococcal bacteremia. He was later found to have pseudomonal pneumonia as well. He also then developed C. difficile colitis. Mechanical ventilation was weaned off on 02/12/2017. His Foley catheter was Lasix changed on 02/20/2017.  His creatinine was 2.2 upon [facility].  We are now consulted for worsening renal function. Creatinine is up to 3.02.  He has been noted as having intermittent low blood pressure.   Medications: Humalog insulin sliding scale, amlodipine 10 mg daily, aspirin 81 mg daily, carvedilol 25 mg twice a day, daptomycin 500 mg IV daily, alum was 2.5 mg twice a day, Pepcid 20 mg twice a day, Neurontin 300 mg twice a day, mirtazapine 15 mg daily, renal vitamin 1 tablet  daily, oxycodone 5 mg every 8 hours, protein supplement twice a day, Renvela 800 mg 3 times a day, vancomycin 125 mg every 6 hours, vitamin C 500 mg daily, zinc sulfate 2020 mg daily   Allergies: Allergies  Allergen Reactions  . Beef Allergy Swelling    Limits intake due to gout.  . Fish-Derived Products Other (See Comments)    Gout Precautions.  . Milk-Related Compounds Other (See Comments)    Gout Precautions.  . Pork-Derived Products Other (See Comments)    Due to interfering in gout outbreak.   Marland Kitchen. Shellfish-Derived Products Other (See Comments)    Headaches and dizziness.   . Shrimp [Shellfish Allergy] Other (See Comments)    Gout precautions.       Past Medical History: Past Medical History:  Diagnosis Date  . Arthritis   . Back pain   . Benign hypertension with CKD (chronic kidney disease) stage III   . Cardiomyopathy due to hypertension (HCC)   . CHF (congestive heart failure) (HCC)   . Coronary artery disease    2 stents 2004  . CVA (cerebral infarction)    Residual Right Hemiparesis  . DM type 2 (diabetes mellitus, type 2) (HCC)   . DVT (deep venous thrombosis) (HCC)   . GERD (gastroesophageal reflux disease)   . Gout   . Hyperlipemia   . Obesity, Class I, BMI 30.0-34.9 (see actual BMI)   .  Pulmonary hypertension      Past Surgical History: Past Surgical History:  Procedure Laterality Date  . CORONARY STENT PLACEMENT     2004  . HERNIA REPAIR    . left knee surgery    . MENISCECTOMY Left    2004     Family History: Family History  Problem Relation Age of Onset  . Cancer - Other Mother        Brain  . Asthma Mother   . HIV Father   . Diabetes Mellitus I Sister   . Diabetes Sister   . Hypertension Sister   . Diabetes Mellitus I Brother   . Diabetes Brother   . Hypertension Brother      Social History: Social History   Social History  . Marital status: Single    Spouse name: N/A  . Number of children: N/A  . Years of education: N/A    Occupational History  . Not on file.   Social History Main Topics  . Smoking status: Never Smoker  . Smokeless tobacco: Never Used  . Alcohol use Not on file  . Drug use: Unknown  . Sexual activity: Not on file   Other Topics Concern  . Not on file   Social History Narrative  . No narrative on file     Review of Systems: Difficult to understand patient's speech and therefore unable to obtain ROS  Vital Signs: Temperature 97.9 pulse 82 respirations 10 blood pressure 107/68 Weight trends: There were no vitals filed for this visit.  Physical Exam: General: Frail appearing male  Head: Temporal wasting noted  Eyes: Anicteric, EOMI  Nose: NG tube in place  Throat: Oropharynx nonerythematous, no exudate appreciated.   Neck: Supple, trachea midline.  Lungs:  Normal respiratory effort. Clear to auscultation BL without crackles or wheezes.  Heart: S1S2 no rubs  Abdomen:  BS normoactive. Soft, Nondistended, non-tender.  No masses or organomegaly.  Extremities: 2+ b/l LE edema, heel protectors on  Neurologic: A&O X3, Motor strength is 5/5 in the all 4 extremities  Skin: Erythema left first toe    Lab results: Basic Metabolic Panel:  Recent Labs Lab 03/04/17 0718 03/06/17 0548 03/07/17 0643  NA 140 134*  --   K 3.8 4.2  --   CL 107 99*  --   CO2 26 24  --   GLUCOSE 105* 101*  --   BUN 55* 80*  --   CREATININE 2.53* 3.02*  --   CALCIUM 8.4* 8.4*  --   MG  --   --  2.0  PHOS  --   --  3.1    Liver Function Tests: No results for input(s): AST, ALT, ALKPHOS, BILITOT, PROT, ALBUMIN in the last 168 hours. No results for input(s): LIPASE, AMYLASE in the last 168 hours. No results for input(s): AMMONIA in the last 168 hours.  CBC:  Recent Labs Lab 03/04/17 0718  WBC 11.4*  HGB 7.0*  HCT 23.2*  MCV 90.6  PLT 491*    Cardiac Enzymes: No results for input(s): CKTOTAL, CKMB, CKMBINDEX, TROPONINI in the last 168 hours.  BNP: Invalid input(s):  POCBNP  CBG: No results for input(s): GLUCAP in the last 168 hours.  Microbiology: Results for orders placed or performed during the hospital encounter of 02/25/17  Culture, Urine     Status: Abnormal   Collection Time: 02/26/17  7:31 AM  Result Value Ref Range Status   Specimen Description URINE, RANDOM  Final   Special Requests  NONE  Final   Culture >=100,000 COLONIES/mL YEAST (A)  Final   Report Status 02/27/2017 FINAL  Final  Culture, blood (routine x 2)     Status: None (Preliminary result)   Collection Time: 03/03/17  7:00 PM  Result Value Ref Range Status   Specimen Description BLOOD LEFT HAND  Final   Special Requests   Final    BOTTLES DRAWN AEROBIC AND ANAEROBIC Blood Culture adequate volume   Culture NO GROWTH 4 DAYS  Final   Report Status PENDING  Incomplete  Culture, blood (routine x 2)     Status: None (Preliminary result)   Collection Time: 03/03/17  7:21 PM  Result Value Ref Range Status   Specimen Description BLOOD RIGHT HAND  Final   Special Requests   Final    BOTTLES DRAWN AEROBIC AND ANAEROBIC Blood Culture adequate volume   Culture NO GROWTH 4 DAYS  Final   Report Status PENDING  Incomplete    Coagulation Studies: No results for input(s): LABPROT, INR in the last 72 hours.  Urinalysis: No results for input(s): COLORURINE, LABSPEC, PHURINE, GLUCOSEU, HGBUR, BILIRUBINUR, KETONESUR, PROTEINUR, UROBILINOGEN, NITRITE, LEUKOCYTESUR in the last 72 hours.  Invalid input(s): APPERANCEUR    Imaging:  No results found.   Assessment & Plan: Pt is a 51 y.o. male with a PMHx of septic shock, functional quadriplegia, Clostridium difficile colitis, MRSA bacteremia, enterococcal bacteremia, right lower lobe ammonia secondary to Pseudomonas hypertension, recent acute respiratory failure, chronic anticoagulation, history of CVA, diabetes mellitus type 2, cardiomyopathy, pulmonary hypertension, chronic kidney disease stage III, gout, who was admitted to [facility] on 02/25/2017 for ongoing management.   1.  Acute renal failure. 2.  CKD stage III. 3.  Mild hyponatremia.  4.  Severe protein calorie malnutrition.  Plan:  Exact etiology of his acute renal failure unclear now but the patient has had recent sepsis and prior episode of acute renal failure. We will proceed with renal ultrasound. Case discussed with hospitalist. We will provide gentle IV fluid hydration with 0.9 normal saline for the next 24-48 hours. He doesn't appear to be on any other obvious nephrotoxins. No urgent indication for dialysis at the moment however if renal function continues to deteriorate we may need to consider this. Thanks for consultation.

## 2017-03-08 LAB — CULTURE, BLOOD (ROUTINE X 2)
CULTURE: NO GROWTH
Culture: NO GROWTH
SPECIAL REQUESTS: ADEQUATE
SPECIAL REQUESTS: ADEQUATE

## 2017-03-08 LAB — BASIC METABOLIC PANEL
ANION GAP: 11 (ref 5–15)
BUN: 103 mg/dL — ABNORMAL HIGH (ref 6–20)
CALCIUM: 8.6 mg/dL — AB (ref 8.9–10.3)
CO2: 24 mmol/L (ref 22–32)
Chloride: 100 mmol/L — ABNORMAL LOW (ref 101–111)
Creatinine, Ser: 3.25 mg/dL — ABNORMAL HIGH (ref 0.61–1.24)
GFR, EST AFRICAN AMERICAN: 24 mL/min — AB (ref >60)
GFR, EST NON AFRICAN AMERICAN: 21 mL/min — AB (ref >60)
GLUCOSE: 95 mg/dL (ref 65–99)
POTASSIUM: 4.1 mmol/L (ref 3.5–5.1)
Sodium: 135 mmol/L (ref 135–145)

## 2017-03-08 LAB — URINE CULTURE

## 2017-03-08 LAB — MAGNESIUM: MAGNESIUM: 2.3 mg/dL (ref 1.7–2.4)

## 2017-03-09 ENCOUNTER — Other Ambulatory Visit (HOSPITAL_COMMUNITY): Payer: Self-pay

## 2017-03-09 LAB — RENAL FUNCTION PANEL
Albumin: 1.4 g/dL — ABNORMAL LOW (ref 3.5–5.0)
Anion gap: 11 (ref 5–15)
BUN: 96 mg/dL — ABNORMAL HIGH (ref 6–20)
CALCIUM: 8.6 mg/dL — AB (ref 8.9–10.3)
CO2: 24 mmol/L (ref 22–32)
CREATININE: 3 mg/dL — AB (ref 0.61–1.24)
Chloride: 100 mmol/L — ABNORMAL LOW (ref 101–111)
GFR calc non Af Amer: 23 mL/min — ABNORMAL LOW (ref >60)
GFR, EST AFRICAN AMERICAN: 26 mL/min — AB (ref >60)
Glucose, Bld: 130 mg/dL — ABNORMAL HIGH (ref 65–99)
Phosphorus: 3.4 mg/dL (ref 2.5–4.6)
Potassium: 4 mmol/L (ref 3.5–5.1)
Sodium: 135 mmol/L (ref 135–145)

## 2017-03-09 LAB — CBC WITH DIFFERENTIAL/PLATELET
Basophils Absolute: 0 10*3/uL (ref 0.0–0.1)
Basophils Relative: 0 %
EOS ABS: 0.2 10*3/uL (ref 0.0–0.7)
Eosinophils Relative: 2 %
HCT: 22 % — ABNORMAL LOW (ref 39.0–52.0)
HEMOGLOBIN: 6.9 g/dL — AB (ref 13.0–17.0)
LYMPHS ABS: 2.7 10*3/uL (ref 0.7–4.0)
Lymphocytes Relative: 25 %
MCH: 27.3 pg (ref 26.0–34.0)
MCHC: 31.4 g/dL (ref 30.0–36.0)
MCV: 87 fL (ref 78.0–100.0)
Monocytes Absolute: 0.9 10*3/uL (ref 0.1–1.0)
Monocytes Relative: 8 %
NEUTROS PCT: 66 %
Neutro Abs: 7.2 10*3/uL (ref 1.7–7.7)
Platelets: 563 10*3/uL — ABNORMAL HIGH (ref 150–400)
RBC: 2.53 MIL/uL — AB (ref 4.22–5.81)
RDW: 14.2 % (ref 11.5–15.5)
WBC: 11 10*3/uL — AB (ref 4.0–10.5)

## 2017-03-09 LAB — ABO/RH: ABO/RH(D): A POS

## 2017-03-09 LAB — PREPARE RBC (CROSSMATCH)

## 2017-03-09 LAB — MAGNESIUM: MAGNESIUM: 2.3 mg/dL (ref 1.7–2.4)

## 2017-03-09 NOTE — Progress Notes (Signed)
[facility]  ROUNDING NOTE   Subjective:  Patient resting in bed. Hemoglobin down to 6.9. He will be receiving blood transfusion. Creatinine slightly down to 3.0.   Objective:  Vital signs in last 24 hours:  Temperature 99.9 pulse 92 respirations 18 blood pressure 142/86  Physical Exam: General: Chronically ill-appearing   Head: Temporal wasting noted, NG tube in place   Eyes: Anicteric  Neck: Supple, trachea midline  Lungs:  Scattered rhonchi, normal effort   Heart: S1S2 no rubs  Abdomen:  Soft, nontender, bowel sounds present  Extremities: 2+ peripheral edema.  Neurologic: Awake, alert, following commands  Skin: No lesions       Basic Metabolic Panel:  Recent Labs Lab 03/04/17 0718 03/06/17 0548 03/07/17 0643 03/08/17 0614 03/09/17 0559  NA 140 134*  --  135 135  K 3.8 4.2  --  4.1 4.0  CL 107 99*  --  100* 100*  CO2 26 24  --  24 24  GLUCOSE 105* 101*  --  95 130*  BUN 55* 80*  --  103* 96*  CREATININE 2.53* 3.02*  --  3.25* 3.00*  CALCIUM 8.4* 8.4*  --  8.6* 8.6*  MG  --   --  2.0 2.3 2.3  PHOS  --   --  3.1  --  3.4    Liver Function Tests:  Recent Labs Lab 03/09/17 0559  ALBUMIN 1.4*   No results for input(s): LIPASE, AMYLASE in the last 168 hours. No results for input(s): AMMONIA in the last 168 hours.  CBC:  Recent Labs Lab 03/04/17 0718 03/09/17 0559  WBC 11.4* 11.0*  NEUTROABS  --  7.2  HGB 7.0* 6.9*  HCT 23.2* 22.0*  MCV 90.6 87.0  PLT 491* 563*    Cardiac Enzymes: No results for input(s): CKTOTAL, CKMB, CKMBINDEX, TROPONINI in the last 168 hours.  BNP: Invalid input(s): POCBNP  CBG: No results for input(s): GLUCAP in the last 168 hours.  Microbiology: Results for orders placed or performed during the hospital encounter of 02/25/17  Culture, Urine     Status: Abnormal   Collection Time: 02/26/17  7:31 AM  Result Value Ref Range Status   Specimen Description URINE, RANDOM  Final   Special Requests NONE   Final   Culture >=100,000 COLONIES/mL YEAST (A)  Final   Report Status 02/27/2017 FINAL  Final  Culture, blood (routine x 2)     Status: None   Collection Time: 03/03/17  7:00 PM  Result Value Ref Range Status   Specimen Description BLOOD LEFT HAND  Final   Special Requests   Final    BOTTLES DRAWN AEROBIC AND ANAEROBIC Blood Culture adequate volume   Culture NO GROWTH 5 DAYS  Final   Report Status 03/08/2017 FINAL  Final  Culture, blood (routine x 2)     Status: None   Collection Time: 03/03/17  7:21 PM  Result Value Ref Range Status   Specimen Description BLOOD RIGHT HAND  Final   Special Requests   Final    BOTTLES DRAWN AEROBIC AND ANAEROBIC Blood Culture adequate volume   Culture NO GROWTH 5 DAYS  Final   Report Status 03/08/2017 FINAL  Final  Culture, Urine     Status: Abnormal   Collection Time: 03/07/17 11:34 AM  Result Value Ref Range Status   Specimen Description URINE, CATHETERIZED  Final   Special Requests NONE  Final   Culture <10,000 COLONIES/mL INSIGNIFICANT GROWTH (A)  Final   Report  Status 03/08/2017 FINAL  Final    Coagulation Studies: No results for input(s): LABPROT, INR in the last 72 hours.  Urinalysis:  Recent Labs  03/07/17 1710  COLORURINE YELLOW  LABSPEC 1.012  PHURINE 5.0  GLUCOSEU NEGATIVE  HGBUR NEGATIVE  BILIRUBINUR NEGATIVE  KETONESUR NEGATIVE  PROTEINUR NEGATIVE  NITRITE NEGATIVE  LEUKOCYTESUR LARGE*      Imaging: [facility]  Result Date: 03/08/2017 CLINICAL DATA:  Acute renal failure. History of diabetes and chronic kidney disease. EXAM: RENAL / URINARY TRACT ULTRASOUND COMPLETE COMPARISON:  None. FINDINGS: Right Kidney: Length: 9.8 cm. Increased cortical echogenicity. No hydronephrosis. 1.5 cm and 1.3 cm anechoic simple cysts RIGHT upper and interpolar kidney. Left Kidney: Length: 11.1 cm. Increased cortical echogenicity. No hydronephrosis. 1.4 cm LEFT upper pole anechoic simple cyst. Bladder: Decompressed by Foley catheter.  IMPRESSION: Echogenic kidneys consistent with medical renal disease. No obstructive uropathy. Electronically Signed   By: Awilda Metroourtnay  Bloomer M.D.   On: 03/08/2017 04:08     Medications:       Assessment/ Plan:  51 y.o. male with a PMHx of septic shock, functional quadriplegia, Clostridium difficile colitis, MRSA bacteremia, enterococcal bacteremia, right lower lobe ammonia secondary to Pseudomonas hypertension, recent acute respiratory failure, chronic anticoagulation, history of CVA, diabetes mellitus type 2, cardiomyopathy, pulmonary hypertension, chronic kidney disease stage III, gout, who was admitted to [facility] on 02/25/2017 for ongoing management.   1.  Acute renal failure. 2.  CKD stage III. 3.  Mild hyponatremia.  4.  Severe protein calorie malnutrition.  Plan:  Renal function has improved slightly. BUN currently 96 with a creatinine of 3.0. We recommend continued IV fluid hydration. He will also benefit from blood administration today. Serum sodium has improved to 135. He is also receiving tube feeds through NG tube.  Overall however he remains quite ill and he has a guarded prognosis.   LOS: 0 Jaeger Trueheart 10/31/20188:54 AM

## 2017-03-10 LAB — CBC
HCT: 25.9 % — ABNORMAL LOW (ref 39.0–52.0)
Hemoglobin: 8.2 g/dL — ABNORMAL LOW (ref 13.0–17.0)
MCH: 27.2 pg (ref 26.0–34.0)
MCHC: 31.7 g/dL (ref 30.0–36.0)
MCV: 86 fL (ref 78.0–100.0)
PLATELETS: 649 10*3/uL — AB (ref 150–400)
RBC: 3.01 MIL/uL — ABNORMAL LOW (ref 4.22–5.81)
RDW: 14.9 % (ref 11.5–15.5)
WBC: 12.8 10*3/uL — AB (ref 4.0–10.5)

## 2017-03-10 LAB — BASIC METABOLIC PANEL
ANION GAP: 10 (ref 5–15)
BUN: 91 mg/dL — ABNORMAL HIGH (ref 6–20)
CALCIUM: 8.7 mg/dL — AB (ref 8.9–10.3)
CO2: 25 mmol/L (ref 22–32)
Chloride: 102 mmol/L (ref 101–111)
Creatinine, Ser: 2.92 mg/dL — ABNORMAL HIGH (ref 0.61–1.24)
GFR, EST AFRICAN AMERICAN: 27 mL/min — AB (ref >60)
GFR, EST NON AFRICAN AMERICAN: 24 mL/min — AB (ref >60)
Glucose, Bld: 109 mg/dL — ABNORMAL HIGH (ref 65–99)
Potassium: 4.2 mmol/L (ref 3.5–5.1)
SODIUM: 137 mmol/L (ref 135–145)

## 2017-03-10 LAB — BPAM RBC
BLOOD PRODUCT EXPIRATION DATE: 201811122359
ISSUE DATE / TIME: 201810311433
Unit Type and Rh: 6200

## 2017-03-10 LAB — TYPE AND SCREEN
ABO/RH(D): A POS
ANTIBODY SCREEN: NEGATIVE
Unit division: 0

## 2017-03-11 LAB — RENAL FUNCTION PANEL
ALBUMIN: 1.5 g/dL — AB (ref 3.5–5.0)
ANION GAP: 12 (ref 5–15)
BUN: 89 mg/dL — ABNORMAL HIGH (ref 6–20)
CALCIUM: 8.4 mg/dL — AB (ref 8.9–10.3)
CO2: 22 mmol/L (ref 22–32)
Chloride: 103 mmol/L (ref 101–111)
Creatinine, Ser: 2.97 mg/dL — ABNORMAL HIGH (ref 0.61–1.24)
GFR, EST AFRICAN AMERICAN: 27 mL/min — AB (ref >60)
GFR, EST NON AFRICAN AMERICAN: 23 mL/min — AB (ref >60)
Glucose, Bld: 128 mg/dL — ABNORMAL HIGH (ref 65–99)
PHOSPHORUS: 3.9 mg/dL (ref 2.5–4.6)
Potassium: 4.1 mmol/L (ref 3.5–5.1)
SODIUM: 137 mmol/L (ref 135–145)

## 2017-03-11 LAB — MAGNESIUM: Magnesium: 2.2 mg/dL (ref 1.7–2.4)

## 2017-03-11 NOTE — Progress Notes (Signed)
[facility]  ROUNDING NOTE   Subjective:  Patient remains on IV fluid hydration. BUN has drifted down slightly to 89 however creatinine remains high at 2.97.Marland Kitchen   Objective:  Vital signs in last 24 hours:  Temperature 100.3 pulse 100 respirations 11 blood pressure 143/88  Physical Exam: General: Chronically ill-appearing   Head: Temporal wasting noted, NG tube in place   Eyes: Anicteric  Neck: Supple, trachea midline  Lungs:  Scattered rhonchi, normal effort   Heart: S1S2 no rubs  Abdomen:  Soft, nontender, bowel sounds present  Extremities: 1+ peripheral edema.  Neurologic: Awake, alert, following commands  Skin: No lesions       Basic Metabolic Panel:  Recent Labs Lab 03/06/17 0548 03/07/17 0643 03/08/17 0614 03/09/17 0559 03/10/17 0825 03/11/17 0516  NA 134*  --  135 135 137 137  K 4.2  --  4.1 4.0 4.2 4.1  CL 99*  --  100* 100* 102 103  CO2 24  --  24 24 25 22   GLUCOSE 101*  --  95 130* 109* 128*  BUN 80*  --  103* 96* 91* 89*  CREATININE 3.02*  --  3.25* 3.00* 2.92* 2.97*  CALCIUM 8.4*  --  8.6* 8.6* 8.7* 8.4*  MG  --  2.0 2.3 2.3  --  2.2  PHOS  --  3.1  --  3.4  --  3.9    Liver Function Tests:  Recent Labs Lab 03/09/17 0559 03/11/17 0516  ALBUMIN 1.4* 1.5*   No results for input(s): LIPASE, AMYLASE in the last 168 hours. No results for input(s): AMMONIA in the last 168 hours.  CBC:  Recent Labs Lab 03/09/17 0559 03/10/17 0825  WBC 11.0* 12.8*  NEUTROABS 7.2  --   HGB 6.9* 8.2*  HCT 22.0* 25.9*  MCV 87.0 86.0  PLT 563* 649*    Cardiac Enzymes: No results for input(s): CKTOTAL, CKMB, CKMBINDEX, TROPONINI in the last 168 hours.  BNP: Invalid input(s): POCBNP  CBG: No results for input(s): GLUCAP in the last 168 hours.  Microbiology: Results for orders placed or performed during the hospital encounter of 02/25/17  Culture, Urine     Status: Abnormal   Collection Time: 02/26/17  7:31 AM  Result Value Ref Range Status    Specimen Description URINE, RANDOM  Final   Special Requests NONE  Final   Culture >=100,000 COLONIES/mL YEAST (A)  Final   Report Status 02/27/2017 FINAL  Final  Culture, blood (routine x 2)     Status: None   Collection Time: 03/03/17  7:00 PM  Result Value Ref Range Status   Specimen Description BLOOD LEFT HAND  Final   Special Requests   Final    BOTTLES DRAWN AEROBIC AND ANAEROBIC Blood Culture adequate volume   Culture NO GROWTH 5 DAYS  Final   Report Status 03/08/2017 FINAL  Final  Culture, blood (routine x 2)     Status: None   Collection Time: 03/03/17  7:21 PM  Result Value Ref Range Status   Specimen Description BLOOD RIGHT HAND  Final   Special Requests   Final    BOTTLES DRAWN AEROBIC AND ANAEROBIC Blood Culture adequate volume   Culture NO GROWTH 5 DAYS  Final   Report Status 03/08/2017 FINAL  Final  Culture, Urine     Status: Abnormal   Collection Time: 03/07/17 11:34 AM  Result Value Ref Range Status   Specimen Description URINE, CATHETERIZED  Final   Special Requests NONE  Final   Culture <10,000 COLONIES/mL INSIGNIFICANT GROWTH (A)  Final   Report Status 03/08/2017 FINAL  Final    Coagulation Studies: No results for input(s): LABPROT, INR in the last 72 hours.  Urinalysis: No results for input(s): COLORURINE, LABSPEC, PHURINE, GLUCOSEU, HGBUR, BILIRUBINUR, KETONESUR, PROTEINUR, UROBILINOGEN, NITRITE, LEUKOCYTESUR in the last 72 hours.  Invalid input(s): APPERANCEUR    Imaging: No results found.   Medications:       Assessment/ Plan:  51 y.o. male with a PMHx of septic shock, functional quadriplegia, Clostridium difficile colitis, MRSA bacteremia, enterococcal bacteremia, right lower lobe ammonia secondary to Pseudomonas hypertension, recent acute respiratory failure, chronic anticoagulation, history of CVA, diabetes mellitus type 2, cardiomyopathy, pulmonary hypertension, chronic kidney disease stage III, gout, who was admitted to [facility] on 02/25/2017 for ongoing management.   1.  Acute renal failure. 2.  CKD stage III.  Baseline creatinine 1.5 3.  Mild hyponatremia.  4.  Severe protein calorie malnutrition.  Plan:  BUN still remains high at 89 with a creatinine of 2.97. Suspect azotemia secondary to use of tube feeds.  Unclear as to whether he will be able to achieve his prior baseline creatinine of 1.5. Continue IV fluid hydration for now. Urine output was 500 cc over the last shift. No urgent indication for dialysis at the moment. We will continue to monitor along his renal function. Guarded prognosis.   LOS: 0 Promiss Labarbera 11/2/20188:14 PM

## 2017-03-12 LAB — C DIFFICILE QUICK SCREEN W PCR REFLEX
C DIFFICILE (CDIFF) INTERP: NOT DETECTED
C Diff antigen: NEGATIVE
C Diff toxin: NEGATIVE

## 2017-03-13 LAB — CBC
HCT: 24.3 % — ABNORMAL LOW (ref 39.0–52.0)
HEMATOCRIT: 22.2 % — AB (ref 39.0–52.0)
HEMOGLOBIN: 6.9 g/dL — AB (ref 13.0–17.0)
Hemoglobin: 7.9 g/dL — ABNORMAL LOW (ref 13.0–17.0)
MCH: 27.4 pg (ref 26.0–34.0)
MCH: 28.2 pg (ref 26.0–34.0)
MCHC: 31.1 g/dL (ref 30.0–36.0)
MCHC: 32.5 g/dL (ref 30.0–36.0)
MCV: 88.1 fL (ref 78.0–100.0)
MCV: 86.8 fL (ref 78.0–100.0)
PLATELETS: 795 10*3/uL — AB (ref 150–400)
Platelets: 728 10*3/uL — ABNORMAL HIGH (ref 150–400)
RBC: 2.52 MIL/uL — ABNORMAL LOW (ref 4.22–5.81)
RBC: 2.8 MIL/uL — AB (ref 4.22–5.81)
RDW: 14.3 % (ref 11.5–15.5)
RDW: 14.5 % (ref 11.5–15.5)
WBC: 10.8 10*3/uL — ABNORMAL HIGH (ref 4.0–10.5)
WBC: 13.8 10*3/uL — ABNORMAL HIGH (ref 4.0–10.5)

## 2017-03-13 LAB — RENAL FUNCTION PANEL
ANION GAP: 10 (ref 5–15)
Albumin: 1.3 g/dL — ABNORMAL LOW (ref 3.5–5.0)
BUN: 86 mg/dL — AB (ref 6–20)
CHLORIDE: 105 mmol/L (ref 101–111)
CO2: 22 mmol/L (ref 22–32)
Calcium: 8 mg/dL — ABNORMAL LOW (ref 8.9–10.3)
Creatinine, Ser: 2.93 mg/dL — ABNORMAL HIGH (ref 0.61–1.24)
GFR calc Af Amer: 27 mL/min — ABNORMAL LOW (ref >60)
GFR calc non Af Amer: 23 mL/min — ABNORMAL LOW (ref >60)
GLUCOSE: 98 mg/dL (ref 65–99)
PHOSPHORUS: 5.1 mg/dL — AB (ref 2.5–4.6)
POTASSIUM: 4.1 mmol/L (ref 3.5–5.1)
Sodium: 137 mmol/L (ref 135–145)

## 2017-03-13 LAB — MAGNESIUM: Magnesium: 2.1 mg/dL (ref 1.7–2.4)

## 2017-03-13 LAB — PREPARE RBC (CROSSMATCH)

## 2017-03-14 LAB — TYPE AND SCREEN
ABO/RH(D): A POS
ANTIBODY SCREEN: NEGATIVE
UNIT DIVISION: 0

## 2017-03-14 LAB — BPAM RBC
BLOOD PRODUCT EXPIRATION DATE: 201811122359
ISSUE DATE / TIME: 201811041146
UNIT TYPE AND RH: 6200

## 2017-03-14 NOTE — Progress Notes (Signed)
[facility]  ROUNDING NOTE   Subjective:  No new renal function testing available today. Patient appears to be resting comfortably. Hemoglobin up to 7.9 posttransfusion.   Objective:  Vital signs in last 24 hours:  Temperature 99.4 pulse 107 respirations 16 blood pressure 150/91  Physical Exam: General: Chronically ill-appearing   Head: Temporal wasting noted  Eyes: Anicteric  Neck: Supple, trachea midline  Lungs:  Scattered rhonchi, normal effort   Heart: S1S2 no rubs  Abdomen:  Soft, nontender, bowel sounds present  Extremities: 1+ peripheral edema.  Neurologic: Awake, alert, following commands  Skin: No lesions       Basic Metabolic Panel: Recent Labs  Lab 03/08/17 0614 03/09/17 0559 03/10/17 0825 03/11/17 0516 03/13/17 0420  NA 135 135 137 137 137  K 4.1 4.0 4.2 4.1 4.1  CL 100* 100* 102 103 105  CO2 24 24 25 22 22   GLUCOSE 95 130* 109* 128* 98  BUN 103* 96* 91* 89* 86*  CREATININE 3.25* 3.00* 2.92* 2.97* 2.93*  CALCIUM 8.6* 8.6* 8.7* 8.4* 8.0*  MG 2.3 2.3  --  2.2 2.1  PHOS  --  3.4  --  3.9 5.1*    Liver Function Tests: Recent Labs  Lab 03/09/17 0559 03/11/17 0516 03/13/17 0420  ALBUMIN 1.4* 1.5* 1.3*   No results for input(s): LIPASE, AMYLASE in the last 168 hours. No results for input(s): AMMONIA in the last 168 hours.  CBC: Recent Labs  Lab 03/09/17 0559 03/10/17 0825 03/13/17 0420 03/13/17 1856  WBC 11.0* 12.8* 10.8* 13.8*  NEUTROABS 7.2  --   --   --   HGB 6.9* 8.2* 6.9* 7.9*  HCT 22.0* 25.9* 22.2* 24.3*  MCV 87.0 86.0 88.1 86.8  PLT 563* 649* 728* 795*    Cardiac Enzymes: No results for input(s): CKTOTAL, CKMB, CKMBINDEX, TROPONINI in the last 168 hours.  BNP: Invalid input(s): POCBNP  CBG: No results for input(s): GLUCAP in the last 168 hours.  Microbiology: Results for orders placed or performed during the hospital encounter of 02/25/17  Culture, Urine     Status: Abnormal   Collection Time: 02/26/17   7:31 AM  Result Value Ref Range Status   Specimen Description URINE, RANDOM  Final   Special Requests NONE  Final   Culture >=100,000 COLONIES/mL YEAST (A)  Final   Report Status 02/27/2017 FINAL  Final  Culture, blood (routine x 2)     Status: None   Collection Time: 03/03/17  7:00 PM  Result Value Ref Range Status   Specimen Description BLOOD LEFT HAND  Final   Special Requests   Final    BOTTLES DRAWN AEROBIC AND ANAEROBIC Blood Culture adequate volume   Culture NO GROWTH 5 DAYS  Final   Report Status 03/08/2017 FINAL  Final  Culture, blood (routine x 2)     Status: None   Collection Time: 03/03/17  7:21 PM  Result Value Ref Range Status   Specimen Description BLOOD RIGHT HAND  Final   Special Requests   Final    BOTTLES DRAWN AEROBIC AND ANAEROBIC Blood Culture adequate volume   Culture NO GROWTH 5 DAYS  Final   Report Status 03/08/2017 FINAL  Final  Culture, Urine     Status: Abnormal   Collection Time: 03/07/17 11:34 AM  Result Value Ref Range Status   Specimen Description URINE, CATHETERIZED  Final   Special Requests NONE  Final   Culture <10,000 COLONIES/mL INSIGNIFICANT GROWTH (A)  Final   Report  Status 03/08/2017 FINAL  Final  C difficile quick scan w PCR reflex     Status: None   Collection Time: 03/12/17  6:29 PM  Result Value Ref Range Status   C Diff antigen NEGATIVE NEGATIVE Final   C Diff toxin NEGATIVE NEGATIVE Final   C Diff interpretation No C. difficile detected.  Final    Coagulation Studies: No results for input(s): LABPROT, INR in the last 72 hours.  Urinalysis: No results for input(s): COLORURINE, LABSPEC, PHURINE, GLUCOSEU, HGBUR, BILIRUBINUR, KETONESUR, PROTEINUR, UROBILINOGEN, NITRITE, LEUKOCYTESUR in the last 72 hours.  Invalid input(s): APPERANCEUR    Imaging: No results found.   Medications:       Assessment/ Plan:  51 y.o. male with a PMHx of septic shock, functional quadriplegia, Clostridium difficile colitis, MRSA bacteremia,  enterococcal bacteremia, right lower lobe ammonia secondary to Pseudomonas hypertension, recent acute respiratory failure, chronic anticoagulation, history of CVA, diabetes mellitus type 2, cardiomyopathy, pulmonary hypertension, chronic kidney disease stage III, gout, who was admitted to [facility] on 02/25/2017 for ongoing management.   1.  Acute renal failure. 2.  CKD stage III.  Baseline creatinine 1.5 3.  Mild hyponatremia.  4.  Severe protein calorie malnutrition.  Plan:  No new creatinine today.  Recommend continued monitoring of renal function periodically.  He also remains on IV fluid hydration at the moment.  Unclear as to whether he will reestablish his prior baseline.  Severe protein calorie malnutrition persists with most recent serum albumin of 1.3.  We recommend continued monitoring of albumin as well.   LOS: 0 Fahed Morten 11/5/20183:27 PM

## 2017-03-15 LAB — CBC
HCT: 25.8 % — ABNORMAL LOW (ref 39.0–52.0)
Hemoglobin: 8 g/dL — ABNORMAL LOW (ref 13.0–17.0)
MCH: 27 pg (ref 26.0–34.0)
MCHC: 31 g/dL (ref 30.0–36.0)
MCV: 87.2 fL (ref 78.0–100.0)
PLATELETS: 853 10*3/uL — AB (ref 150–400)
RBC: 2.96 MIL/uL — AB (ref 4.22–5.81)
RDW: 14.4 % (ref 11.5–15.5)
WBC: 11.1 10*3/uL — ABNORMAL HIGH (ref 4.0–10.5)

## 2017-03-15 LAB — BASIC METABOLIC PANEL
Anion gap: 9 (ref 5–15)
BUN: 88 mg/dL — AB (ref 6–20)
CO2: 22 mmol/L (ref 22–32)
CREATININE: 2.74 mg/dL — AB (ref 0.61–1.24)
Calcium: 8.3 mg/dL — ABNORMAL LOW (ref 8.9–10.3)
Chloride: 107 mmol/L (ref 101–111)
GFR calc Af Amer: 29 mL/min — ABNORMAL LOW (ref >60)
GFR, EST NON AFRICAN AMERICAN: 25 mL/min — AB (ref >60)
Glucose, Bld: 131 mg/dL — ABNORMAL HIGH (ref 65–99)
Potassium: 4.3 mmol/L (ref 3.5–5.1)
SODIUM: 138 mmol/L (ref 135–145)

## 2017-03-18 LAB — BASIC METABOLIC PANEL
ANION GAP: 8 (ref 5–15)
BUN: 89 mg/dL — ABNORMAL HIGH (ref 6–20)
CO2: 22 mmol/L (ref 22–32)
Calcium: 8.1 mg/dL — ABNORMAL LOW (ref 8.9–10.3)
Chloride: 108 mmol/L (ref 101–111)
Creatinine, Ser: 2.43 mg/dL — ABNORMAL HIGH (ref 0.61–1.24)
GFR, EST AFRICAN AMERICAN: 34 mL/min — AB (ref >60)
GFR, EST NON AFRICAN AMERICAN: 29 mL/min — AB (ref >60)
Glucose, Bld: 102 mg/dL — ABNORMAL HIGH (ref 65–99)
POTASSIUM: 3.8 mmol/L (ref 3.5–5.1)
SODIUM: 138 mmol/L (ref 135–145)

## 2017-03-18 LAB — CBC
HCT: 23.2 % — ABNORMAL LOW (ref 39.0–52.0)
Hemoglobin: 7.1 g/dL — ABNORMAL LOW (ref 13.0–17.0)
MCH: 26.9 pg (ref 26.0–34.0)
MCHC: 30.6 g/dL (ref 30.0–36.0)
MCV: 87.9 fL (ref 78.0–100.0)
PLATELETS: 782 10*3/uL — AB (ref 150–400)
RBC: 2.64 MIL/uL — AB (ref 4.22–5.81)
RDW: 13.8 % (ref 11.5–15.5)
WBC: 9.6 10*3/uL (ref 4.0–10.5)

## 2017-03-18 LAB — PREPARE RBC (CROSSMATCH)

## 2017-03-18 LAB — OCCULT BLOOD X 1 CARD TO LAB, STOOL: Fecal Occult Bld: NEGATIVE

## 2017-03-19 ENCOUNTER — Other Ambulatory Visit (HOSPITAL_COMMUNITY): Payer: Self-pay

## 2017-03-19 LAB — COMPREHENSIVE METABOLIC PANEL
ALBUMIN: 1.4 g/dL — AB (ref 3.5–5.0)
ALT: 17 U/L (ref 17–63)
AST: 21 U/L (ref 15–41)
Alkaline Phosphatase: 96 U/L (ref 38–126)
Anion gap: 8 (ref 5–15)
BUN: 95 mg/dL — AB (ref 6–20)
CHLORIDE: 107 mmol/L (ref 101–111)
CO2: 21 mmol/L — ABNORMAL LOW (ref 22–32)
Calcium: 8.1 mg/dL — ABNORMAL LOW (ref 8.9–10.3)
Creatinine, Ser: 2.44 mg/dL — ABNORMAL HIGH (ref 0.61–1.24)
GFR calc Af Amer: 34 mL/min — ABNORMAL LOW (ref >60)
GFR calc non Af Amer: 29 mL/min — ABNORMAL LOW (ref >60)
GLUCOSE: 121 mg/dL — AB (ref 65–99)
POTASSIUM: 4.1 mmol/L (ref 3.5–5.1)
SODIUM: 136 mmol/L (ref 135–145)
Total Bilirubin: 0.6 mg/dL (ref 0.3–1.2)
Total Protein: 6.4 g/dL — ABNORMAL LOW (ref 6.5–8.1)

## 2017-03-19 LAB — BPAM RBC
BLOOD PRODUCT EXPIRATION DATE: 201811142359
ISSUE DATE / TIME: 201811092149
UNIT TYPE AND RH: 6200

## 2017-03-19 LAB — CBC
HEMATOCRIT: 26.1 % — AB (ref 39.0–52.0)
Hemoglobin: 8.2 g/dL — ABNORMAL LOW (ref 13.0–17.0)
MCH: 27.3 pg (ref 26.0–34.0)
MCHC: 31.4 g/dL (ref 30.0–36.0)
MCV: 87 fL (ref 78.0–100.0)
Platelets: 710 10*3/uL — ABNORMAL HIGH (ref 150–400)
RBC: 3 MIL/uL — ABNORMAL LOW (ref 4.22–5.81)
RDW: 14.1 % (ref 11.5–15.5)
WBC: 12 10*3/uL — AB (ref 4.0–10.5)

## 2017-03-19 LAB — TYPE AND SCREEN
ABO/RH(D): A POS
Antibody Screen: NEGATIVE
Unit division: 0

## 2017-03-21 LAB — URINE CULTURE

## 2017-03-22 ENCOUNTER — Other Ambulatory Visit (HOSPITAL_COMMUNITY): Payer: Self-pay

## 2017-03-22 LAB — PROTIME-INR
INR: 1.72
PROTHROMBIN TIME: 20 s — AB (ref 11.4–15.2)

## 2017-03-22 LAB — RENAL FUNCTION PANEL
ANION GAP: 7 (ref 5–15)
Albumin: 1.2 g/dL — ABNORMAL LOW (ref 3.5–5.0)
BUN: 103 mg/dL — AB (ref 6–20)
CHLORIDE: 106 mmol/L (ref 101–111)
CO2: 23 mmol/L (ref 22–32)
Calcium: 8 mg/dL — ABNORMAL LOW (ref 8.9–10.3)
Creatinine, Ser: 2.45 mg/dL — ABNORMAL HIGH (ref 0.61–1.24)
GFR calc Af Amer: 34 mL/min — ABNORMAL LOW (ref >60)
GFR, EST NON AFRICAN AMERICAN: 29 mL/min — AB (ref >60)
Glucose, Bld: 119 mg/dL — ABNORMAL HIGH (ref 65–99)
POTASSIUM: 4.1 mmol/L (ref 3.5–5.1)
Phosphorus: 4.1 mg/dL (ref 2.5–4.6)
Sodium: 136 mmol/L (ref 135–145)

## 2017-03-22 LAB — CULTURE, RESPIRATORY

## 2017-03-22 LAB — CBC
HEMATOCRIT: 25.4 % — AB (ref 39.0–52.0)
HEMOGLOBIN: 7.8 g/dL — AB (ref 13.0–17.0)
MCH: 27.1 pg (ref 26.0–34.0)
MCHC: 30.7 g/dL (ref 30.0–36.0)
MCV: 88.2 fL (ref 78.0–100.0)
PLATELETS: 637 10*3/uL — AB (ref 150–400)
RBC: 2.88 MIL/uL — ABNORMAL LOW (ref 4.22–5.81)
RDW: 14.3 % (ref 11.5–15.5)
WBC: 10.8 10*3/uL — ABNORMAL HIGH (ref 4.0–10.5)

## 2017-03-22 LAB — CULTURE, RESPIRATORY W GRAM STAIN

## 2017-03-22 LAB — APTT: APTT: 50 s — AB (ref 24–36)

## 2017-03-22 LAB — MAGNESIUM: Magnesium: 2.1 mg/dL (ref 1.7–2.4)

## 2017-03-22 NOTE — Consult Note (Signed)
Chief Complaint: Patient was seen in consultation today for dysphagia  Referring Physician(s): Dr. Sharyon Medicus  Supervising Physician: Richarda Overlie  Patient Status: [name] - In-pt  History of Present Illness: Jimmy Tran is a 51 y.o. male with past medical history of arthritis, CKD, CHF, CAD, CVA, GERD, HLD who is admitted to [facility] for ongoing care.  He has an NGT in place for feeding due to dysphagia.  IR consulted for percutaneous gastrostomy placement at the request of Dr. Sharyon Medicus.   Past Medical History:  Diagnosis Date  . Arthritis   . Back pain   . Benign hypertension with CKD (chronic kidney disease) stage III   . Cardiomyopathy due to hypertension (HCC)   . CHF (congestive heart failure) (HCC)   . Coronary artery disease    2 stents 2004  . CVA (cerebral infarction)    Residual Right Hemiparesis  . DM type 2 (diabetes mellitus, type 2) (HCC)   . DVT (deep venous thrombosis) (HCC)   . GERD (gastroesophageal reflux disease)   . Gout   . Hyperlipemia   . Obesity, Class I, BMI 30.0-34.9 (see actual BMI)   . Pulmonary hypertension     Past Surgical History:  Procedure Laterality Date  . CORONARY STENT PLACEMENT     2004  . HERNIA REPAIR    . left knee surgery    . MENISCECTOMY Left    2004    Allergies: Beef allergy; Fish-derived products; Milk-related compounds; Pork-derived products; Shellfish-derived products; and Shrimp [shellfish allergy]  Medications: Prior to Admission medications   Medication Sig Start Date End Date Taking? Authorizing Provider  allopurinol (ZYLOPRIM) 300 MG tablet Take 300 mg by mouth daily.    [provider]  amLODipine (NORVASC) 10 MG tablet Take 10 mg by mouth daily.    [provider]  apixaban (ELIQUIS) 5 MG TABS tablet Take 5 mg by mouth 2 (two) times daily.    [provider]  atorvastatin (LIPITOR) 20 MG tablet Take 20 mg by mouth daily.    [provider]  carvedilol  (COREG) 25 MG tablet Take 12.5 mg by mouth 2 (two) times daily with a meal.    [provider]  colchicine 0.6 MG tablet Take 0.6 mg by mouth every other day.    [provider]  furosemide (LASIX) 20 MG tablet Take 20 mg by mouth.    [provider]  glimepiride (AMARYL) 1 MG tablet Take 1 mg by mouth daily with breakfast.    [provider]  linagliptin (TRADJENTA) 5 MG TABS tablet Take 5 mg by mouth daily with breakfast.    [provider]  lisinopril (PRINIVIL,ZESTRIL) 20 MG tablet Take 20 mg by mouth daily.    [provider]  mirtazapine (REMERON) 15 MG tablet Take 15 mg by mouth at bedtime.    [provider]  Multiple Vitamin (MULTIVITAMIN) tablet Take 1 tablet by mouth daily.    [provider]     Family History  Problem Relation Age of Onset  . Cancer - Other Mother        Brain  . Asthma Mother   . HIV Father   . Diabetes Mellitus I Sister   . Diabetes Sister   . Hypertension Sister   . Diabetes Mellitus I Brother   . Diabetes Brother   . Hypertension Brother     Social History   Socioeconomic History  . Marital status: Single    Spouse  name: Not on file  . Number of children: Not on file  . Years of education: Not on file  . Highest education level: Not on file  Social Needs  . Financial resource strain: Not on file  . Food insecurity - worry: Not on file  . Food insecurity - inability: Not on file  . Transportation needs - medical: Not on file  . Transportation needs - non-medical: Not on file  Occupational History  . Not on file  Tobacco Use  . Smoking status: Never Smoker  . Smokeless tobacco: Never Used  Substance and Sexual Activity  . Alcohol use: Not on file  . Drug use: Not on file  . Sexual activity: Not on file  Other Topics Concern  . Not on file  Social History Narrative  . Not on file    Review of Systems  Unable to perform ROS: Acuity of condition    Vital  Signs: There were no vitals taken for this visit.  Physical Exam  Constitutional: He appears well-developed.  Cardiovascular: Normal rate, regular rhythm and normal heart sounds.  Pulmonary/Chest: Effort normal and breath sounds normal. No respiratory distress.  Abdominal: Soft. He exhibits no distension. There is no tenderness.  Neurological:  Arouses, opens eyes, does not respond to questions.   Skin: Skin is warm and dry.  Nursing note and vitals reviewed.   Imaging: Ct Abdomen Wo Contrast  Result Date: 03/22/2017 CLINICAL DATA:  Dysphagia, assess for fluoroscopic gastrostomy insertion EXAM: CT ABDOMEN WITHOUT CONTRAST TECHNIQUE: Multidetector CT imaging of the abdomen was performed following the standard protocol without IV contrast. COMPARISON:  None available FINDINGS: Lower chest: Bibasilar atelectasis/consolidation and trace pleural effusions, worse on the left. Basilar pneumonia or aspiration not excluded. Small pericardial effusion. Heart is enlarged. Degenerative changes of the lower thoracic spine. Gynecomastia noted. Hepatobiliary: No significant or acute finding by noncontrast imaging. Gallbladder is collapsed. Biliary system is nondilated. No large focal hepatic abnormality. Pancreas: No focal abnormality or ductal dilatation. No surrounding inflammatory process fluid collection. Spleen: Normal in size.  No focal abnormality. Adrenals/Urinary Tract: Normal adrenal glands. No renal obstruction, perinephric inflammatory process, hydronephrosis or hydroureter. Ureters are symmetric and decompressed. Stomach/Bowel: Feeding tube within the distal stomach antrum. No hiatal hernia. Stomach anatomy is favorable for percutaneous fluoroscopic gastrostomy technique. The anterior gastric wall is inferior to the left hepatic lobe and superior to the transverse colon. No ventral hernia. Negative for bowel obstruction. No abdominal fluid collection or hemorrhage. No abscess. Negative for ileus.  Vascular/Lymphatic: Aortoiliac atherosclerosis noted. Negative for aneurysm. No retroperitoneal abnormality. No adenopathy. Other: No ascites or free fluid. Minor degree of body wall anasarca. Musculoskeletal: Degenerative changes of the spine. Lower lumbar facet arthropathy noted at all IMPRESSION: Stomach anatomy is favorable for fluoroscopic percutaneous gastrostomy technique. See above comment. No acute intra-abdominal finding by noncontrast CT Bibasilar atelectasis/consolidation with small effusions. Basilar pneumonia not excluded. Small pericardial effusion and cardiomegaly Aortoiliac atherosclerosis Electronically Signed   By: Judie PetitM.  Shick M.D.   On: 03/22/2017 14:38   [place] Renal  Result Date: 03/08/2017 CLINICAL DATA:  Acute renal failure. History of diabetes and chronic kidney disease. EXAM: RENAL / URINARY TRACT ULTRASOUND COMPLETE COMPARISON:  None. FINDINGS: Right Kidney: Length: 9.8 cm. Increased cortical echogenicity. No hydronephrosis. 1.5 cm and 1.3 cm anechoic simple cysts RIGHT upper and interpolar kidney. Left Kidney: Length: 11.1 cm. Increased cortical echogenicity. No hydronephrosis. 1.4 cm LEFT upper pole anechoic simple cyst. Bladder: Decompressed by Foley catheter. IMPRESSION: Echogenic kidneys consistent  with medical renal disease. No obstructive uropathy. Electronically Signed   By: Awilda Metro M.D.   On: 03/08/2017 04:08   Dg Chest Port 1 View  Result Date: 03/19/2017 CLINICAL DATA:  Fever EXAM: PORTABLE CHEST 1 VIEW COMPARISON:  February 26, 2017 FINDINGS: There is airspace consolidation in the left base with small left pleural effusion. Lungs elsewhere clear. There is cardiomegaly with pulmonary vascularity within normal limits. No adenopathy. Feeding tube tip is below the diaphragm. IMPRESSION: Left base airspace consolidation consistent with pneumonia. Small left pleural effusion. Lungs elsewhere clear. Stable cardiomegaly. Electronically Signed   By: Bretta Bang III  M.D.   On: 03/19/2017 14:50   Dg Chest Port 1 View  Result Date: 02/26/2017 CLINICAL DATA:  Respiratory failure. EXAM: PORTABLE CHEST 1 VIEW COMPARISON:  None. FINDINGS: Cardiomegaly noted. Mild atelectasis in the right mid lung and left lung base noted. There is no evidence of focal airspace disease, pulmonary edema, suspicious pulmonary nodule/mass, pleural effusion, or pneumothorax. No acute bony abnormalities are identified. IMPRESSION: Mild right mid lung and left basilar atelectasis. Cardiomegaly. Electronically Signed   By: Harmon Pier M.D.   On: 02/26/2017 16:33   Dg Abd Portable 1v  Result Date: 02/28/2017 CLINICAL DATA:  51 year old male status post feeding tube placement. EXAM: PORTABLE ABDOMEN - 1 VIEW COMPARISON:  Abdominal radiograph 02/08/2017. FINDINGS: Tip of feeding tube is either in the antral pre-pyloric region of the stomach or the duodenal bulb. Visualized bowel gas pattern is nonobstructive. IMPRESSION: 1. Tip of feeding tube is either in the antral pre-pyloric region of the stomach or duodenal bulb. Electronically Signed   By: Trudie Reed M.D.   On: 02/28/2017 20:08   Dg Abd Portable 1v  Result Date: 02/26/2017 CLINICAL DATA:  Check nasogastric catheter placement EXAM: PORTABLE ABDOMEN - 1 VIEW COMPARISON:  None. FINDINGS: Scattered large and small bowel gas is noted. Nasogastric catheter is noted deep within the stomach directed towards the pylorus. No acute abnormality is noted. IMPRESSION: Nasogastric catheter in the distal stomach as described. Electronically Signed   By: Alcide Clever M.D.   On: 02/26/2017 19:13   Dg Abd Portable 1v  Result Date: 02/26/2017 CLINICAL DATA:  Ileus EXAM: PORTABLE ABDOMEN - 1 VIEW COMPARISON:  None. FINDINGS: Nonobstructed, nonspecific bowel gas pattern with scattered air containing small and large bowel loops. No bowel obstruction or free air. Moderate colonic stool burden within the colon. There is lower lumbar degenerative facet  arthropathy at L4-5 and L5-S1 with osteoarthritis of both hip joints characterized by joint space narrowing and spurring about the acetabula and femoral heads. Phleboliths are seen in the lower pelvis. No radiopaque calculi are apparent. IMPRESSION: Nonspecific go nondilated bowel gas pattern. No evidence of ileus or bowel obstruction. Electronically Signed   By: Tollie Eth M.D.   On: 02/26/2017 17:34    Labs:  CBC: Recent Labs    03/15/17 0729 03/18/17 0557 03/19/17 0843 03/22/17 0640  WBC 11.1* 9.6 12.0* 10.8*  HGB 8.0* 7.1* 8.2* 7.8*  HCT 25.8* 23.2* 26.1* 25.4*  PLT 853* 782* 710* 637*    COAGS: Recent Labs    02/26/17 0554 03/22/17 1145  INR 2.30 1.72  APTT  --  50*    BMP: Recent Labs    03/15/17 0729 03/18/17 0557 03/19/17 0843 03/22/17 0640  NA 138 138 136 136  K 4.3 3.8 4.1 4.1  CL 107 108 107 106  CO2 22 22 21* 23  GLUCOSE 131* 102* 121* 119*  BUN 88* 89* 95* 103*  CALCIUM 8.3* 8.1* 8.1* 8.0*  CREATININE 2.74* 2.43* 2.44* 2.45*  GFRNONAA 25* 29* 29* 29*  GFRAA 29* 34* 34* 34*    LIVER FUNCTION TESTS: Recent Labs    02/26/17 0554  03/11/17 0516 03/13/17 0420 03/19/17 0843 03/22/17 0640  BILITOT 0.8  --   --   --  0.6  --   AST 14*  --   --   --  21  --   ALT 11*  --   --   --  17  --   ALKPHOS 200*  --   --   --  96  --   PROT 6.6  --   --   --  6.4*  --   ALBUMIN 1.5*   < > 1.5* 1.3* 1.4* 1.2*   < > = values in this interval not displayed.    TUMOR MARKERS: No results for input(s): AFPTM, CEA, CA199, CHROMGRNA in the last 8760 hours.  Assessment and Plan: Patient with past medical history of septic shock, functional quadriplegia presents with complaint of dysphagia.  IR consulted for gastrostomy placement at the request of Dr. Sharyon MedicusHijazi. Case reviewed by Dr. Miles CostainShick who approves patient for procedure.  He is on Eliquis which has been placed on hold.  Will work towards obtaining consent.   Thank you for this interesting consult.  I greatly  enjoyed meeting Carron CurieJoseph B Suarez and look forward to participating in their care.  A copy of this report was sent to the requesting provider on this date.  Electronically Signed: Hoyt KochKacie Sue-Ellen Karrie Fluellen, PA 03/22/2017, 4:54 PM   I spent a total of 40 Minutes   in face to face in clinical consultation, greater than 50% of which was counseling/coordinating care for dysphagia.

## 2017-03-23 MED ORDER — CEFAZOLIN SODIUM-DEXTROSE 2-4 GM/100ML-% IV SOLN
2.0000 g | INTRAVENOUS | Status: AC
  Administered 2017-03-25: 2 g via INTRAVENOUS

## 2017-03-24 LAB — BASIC METABOLIC PANEL
Anion gap: 10 (ref 5–15)
BUN: 95 mg/dL — AB (ref 6–20)
CALCIUM: 8.4 mg/dL — AB (ref 8.9–10.3)
CHLORIDE: 108 mmol/L (ref 101–111)
CO2: 22 mmol/L (ref 22–32)
CREATININE: 2.4 mg/dL — AB (ref 0.61–1.24)
GFR calc non Af Amer: 30 mL/min — ABNORMAL LOW (ref >60)
GFR, EST AFRICAN AMERICAN: 35 mL/min — AB (ref >60)
GLUCOSE: 118 mg/dL — AB (ref 65–99)
Potassium: 3.9 mmol/L (ref 3.5–5.1)
Sodium: 140 mmol/L (ref 135–145)

## 2017-03-24 LAB — CBC
HEMATOCRIT: 25.4 % — AB (ref 39.0–52.0)
HEMOGLOBIN: 7.7 g/dL — AB (ref 13.0–17.0)
MCH: 26.7 pg (ref 26.0–34.0)
MCHC: 30.3 g/dL (ref 30.0–36.0)
MCV: 88.2 fL (ref 78.0–100.0)
Platelets: 503 10*3/uL — ABNORMAL HIGH (ref 150–400)
RBC: 2.88 MIL/uL — ABNORMAL LOW (ref 4.22–5.81)
RDW: 14.2 % (ref 11.5–15.5)
WBC: 10.6 10*3/uL — ABNORMAL HIGH (ref 4.0–10.5)

## 2017-03-24 NOTE — Progress Notes (Signed)
PA has attempted to contact [facility] for consent for gastrostomy tube placement without success.  Will continue to work towards placement.   Loyce DysKacie Matthews, MS RD PA-C 4:33 PM

## 2017-03-25 ENCOUNTER — Other Ambulatory Visit (HOSPITAL_COMMUNITY): Payer: Self-pay

## 2017-03-25 ENCOUNTER — Encounter (HOSPITAL_COMMUNITY): Payer: Self-pay | Admitting: Interventional Radiology

## 2017-03-25 HISTORY — PX: IR GASTROSTOMY TUBE MOD SED: IMG625

## 2017-03-25 LAB — PROTIME-INR
INR: 1.31
Prothrombin Time: 16.2 seconds — ABNORMAL HIGH (ref 11.4–15.2)

## 2017-03-25 MED ORDER — IOPAMIDOL (ISOVUE-300) INJECTION 61%
INTRAVENOUS | Status: AC
  Administered 2017-03-25: 25 mL
  Filled 2017-03-25: qty 50

## 2017-03-25 MED ORDER — MIDAZOLAM HCL 2 MG/2ML IJ SOLN
INTRAMUSCULAR | Status: AC | PRN
  Administered 2017-03-25 (×2): 1 mg via INTRAVENOUS

## 2017-03-25 MED ORDER — ZINC SULFATE 220 (50 ZN) MG PO CAPS
220.00 | ORAL_CAPSULE | ORAL | Status: DC
Start: 2017-02-26 — End: 2017-03-25

## 2017-03-25 MED ORDER — SODIUM CHLORIDE 0.9 % IV SOLN
INTRAVENOUS | Status: DC
Start: ? — End: 2017-03-25

## 2017-03-25 MED ORDER — AMLODIPINE BESYLATE 10 MG PO TABS
10.00 | ORAL_TABLET | ORAL | Status: DC
Start: 2017-02-26 — End: 2017-03-25

## 2017-03-25 MED ORDER — MORPHINE SULFATE ER 15 MG PO TBCR
15.00 | EXTENDED_RELEASE_TABLET | ORAL | Status: DC
Start: 2017-02-25 — End: 2017-03-25

## 2017-03-25 MED ORDER — ASPIRIN 81 MG PO CHEW
81.00 | CHEWABLE_TABLET | ORAL | Status: DC
Start: 2017-02-26 — End: 2017-03-25

## 2017-03-25 MED ORDER — MIDAZOLAM HCL 2 MG/2ML IJ SOLN
INTRAMUSCULAR | Status: AC
  Filled 2017-03-25: qty 6

## 2017-03-25 MED ORDER — CEFAZOLIN SODIUM-DEXTROSE 2-4 GM/100ML-% IV SOLN
INTRAVENOUS | Status: AC
  Filled 2017-03-25: qty 100

## 2017-03-25 MED ORDER — CARVEDILOL 12.5 MG PO TABS
12.50 | ORAL_TABLET | ORAL | Status: DC
Start: 2017-02-25 — End: 2017-03-25

## 2017-03-25 MED ORDER — CLOTRIMAZOLE-BETAMETHASONE 1-0.05 % EX CREA
TOPICAL_CREAM | CUTANEOUS | Status: DC
Start: 2017-02-25 — End: 2017-03-25

## 2017-03-25 MED ORDER — HYDROCODONE-ACETAMINOPHEN 5-325 MG PO TABS
ORAL_TABLET | ORAL | Status: DC
Start: ? — End: 2017-03-25

## 2017-03-25 MED ORDER — SODIUM HYPOCHLORITE EX
CUTANEOUS | Status: DC
Start: 2017-02-25 — End: 2017-03-25

## 2017-03-25 MED ORDER — FENTANYL CITRATE (PF) 100 MCG/2ML IJ SOLN
INTRAMUSCULAR | Status: AC
  Filled 2017-03-25: qty 4

## 2017-03-25 MED ORDER — GABAPENTIN 300 MG PO CAPS
300.00 | ORAL_CAPSULE | ORAL | Status: DC
Start: 2017-02-25 — End: 2017-03-25

## 2017-03-25 MED ORDER — FAMOTIDINE 20 MG PO TABS
20.00 | ORAL_TABLET | ORAL | Status: DC
Start: 2017-02-25 — End: 2017-03-25

## 2017-03-25 MED ORDER — ACETAMINOPHEN 325 MG PO TABS
650.00 | ORAL_TABLET | ORAL | Status: DC
Start: ? — End: 2017-03-25

## 2017-03-25 MED ORDER — MEGESTROL ACETATE 40 MG PO TABS
100.00 | ORAL_TABLET | ORAL | Status: DC
Start: 2017-02-25 — End: 2017-03-25

## 2017-03-25 MED ORDER — LIDOCAINE HCL 1 % IJ SOLN
INTRAMUSCULAR | Status: AC
  Filled 2017-03-25: qty 20

## 2017-03-25 MED ORDER — BENZOCAINE-MENTHOL 15-3.6 MG MT LOZG
LOZENGE | OROMUCOSAL | Status: DC
Start: ? — End: 2017-03-25

## 2017-03-25 MED ORDER — VITAMIN C 500 MG PO TABS
500.00 | ORAL_TABLET | ORAL | Status: DC
Start: 2017-02-26 — End: 2017-03-25

## 2017-03-25 MED ORDER — GLUCAGON HCL RDNA (DIAGNOSTIC) 1 MG IJ SOLR
INTRAMUSCULAR | Status: AC
  Administered 2017-03-25: 1 mg
  Filled 2017-03-25: qty 1

## 2017-03-25 MED ORDER — SENNA-DOCUSATE SODIUM 8.6-50 MG PO TABS
ORAL_TABLET | ORAL | Status: DC
Start: ? — End: 2017-03-25

## 2017-03-25 MED ORDER — VANCOMYCIN HCL IN NACL 1.25-0.9 GM/250ML-% IV SOLN
1.25 | INTRAVENOUS | Status: DC
Start: 2017-02-27 — End: 2017-03-25

## 2017-03-25 MED ORDER — FENTANYL CITRATE (PF) 2500 MCG/50ML IJ SOLN
INTRAMUSCULAR | Status: DC
Start: ? — End: 2017-03-25

## 2017-03-25 MED ORDER — GENERIC EXTERNAL MEDICATION
Status: DC
Start: ? — End: 2017-03-25

## 2017-03-25 MED ORDER — APIXABAN 2.5 MG PO TABS
5.00 | ORAL_TABLET | ORAL | Status: DC
Start: 2017-02-25 — End: 2017-03-25

## 2017-03-25 MED ORDER — FENTANYL CITRATE (PF) 100 MCG/2ML IJ SOLN
INTRAMUSCULAR | Status: AC | PRN
  Administered 2017-03-25: 50 ug via INTRAVENOUS

## 2017-03-25 MED ORDER — LIDOCAINE HCL 1 % IJ SOLN
INTRAMUSCULAR | Status: AC | PRN
Start: 2017-03-25 — End: 2017-03-25
  Administered 2017-03-25: 10 mL

## 2017-03-25 MED ORDER — PANTOPRAZOLE SODIUM 40 MG PO TBEC
40.00 | DELAYED_RELEASE_TABLET | ORAL | Status: DC
Start: 2017-02-26 — End: 2017-03-25

## 2017-03-25 MED ORDER — ACETAMINOPHEN 650 MG RE SUPP
650.00 | RECTAL | Status: DC
Start: ? — End: 2017-03-25

## 2017-03-25 MED ORDER — PHENAZOPYRIDINE HCL 100 MG PO TABS
100.00 | ORAL_TABLET | ORAL | Status: DC
Start: 2017-02-25 — End: 2017-03-25

## 2017-03-25 MED ORDER — TRAZODONE HCL 100 MG PO TABS
100.00 | ORAL_TABLET | ORAL | Status: DC
Start: ? — End: 2017-03-25

## 2017-03-25 MED ORDER — GENERIC EXTERNAL MEDICATION
500.00 | Status: DC
Start: 2017-02-25 — End: 2017-03-25

## 2017-03-25 NOTE — Procedures (Signed)
Pre procedure Dx: Dysphagia Post Procedure Dx: Same  Successful fluoroscopic guided insertion of gastrostomy tube.   The gastrostomy tube may be used immediately for medications.   Tube feeds may be initiated in 24 hours as per the primary team.    EBL: Minimal  Complications: None immediate  Jay Filippo Puls, MD Pager #: [phone]    

## 2017-03-26 ENCOUNTER — Other Ambulatory Visit (HOSPITAL_COMMUNITY): Payer: Self-pay

## 2017-03-26 LAB — VANCOMYCIN, TROUGH: Vancomycin Tr: 38 ug/mL (ref 15–20)

## 2017-03-26 NOTE — Progress Notes (Signed)
Gastrostomy tube intact, small amount of old blood at insertion site, abdomen soft; ok to use tube for feeds as needed; dressing change ordered for site; monitor labs

## 2017-03-27 LAB — RENAL FUNCTION PANEL
Albumin: 1.4 g/dL — ABNORMAL LOW (ref 3.5–5.0)
Anion gap: 9 (ref 5–15)
BUN: 70 mg/dL — ABNORMAL HIGH (ref 6–20)
CHLORIDE: 111 mmol/L (ref 101–111)
CO2: 22 mmol/L (ref 22–32)
CREATININE: 2.13 mg/dL — AB (ref 0.61–1.24)
Calcium: 8.4 mg/dL — ABNORMAL LOW (ref 8.9–10.3)
GFR, EST AFRICAN AMERICAN: 40 mL/min — AB (ref >60)
GFR, EST NON AFRICAN AMERICAN: 34 mL/min — AB (ref >60)
Glucose, Bld: 146 mg/dL — ABNORMAL HIGH (ref 65–99)
Phosphorus: 4.3 mg/dL (ref 2.5–4.6)
Potassium: 3.2 mmol/L — ABNORMAL LOW (ref 3.5–5.1)
Sodium: 142 mmol/L (ref 135–145)

## 2017-03-27 LAB — CBC
HEMATOCRIT: 24.4 % — AB (ref 39.0–52.0)
HEMOGLOBIN: 7.5 g/dL — AB (ref 13.0–17.0)
MCH: 27 pg (ref 26.0–34.0)
MCHC: 30.7 g/dL (ref 30.0–36.0)
MCV: 87.8 fL (ref 78.0–100.0)
PLATELETS: 242 10*3/uL (ref 150–400)
RBC: 2.78 MIL/uL — AB (ref 4.22–5.81)
RDW: 14.4 % (ref 11.5–15.5)
WBC: 10.5 10*3/uL (ref 4.0–10.5)

## 2017-03-27 LAB — MAGNESIUM: MAGNESIUM: 2 mg/dL (ref 1.7–2.4)

## 2017-03-28 LAB — BASIC METABOLIC PANEL
Anion gap: 6 (ref 5–15)
BUN: 64 mg/dL — ABNORMAL HIGH (ref 6–20)
CHLORIDE: 112 mmol/L — AB (ref 101–111)
CO2: 24 mmol/L (ref 22–32)
Calcium: 8.2 mg/dL — ABNORMAL LOW (ref 8.9–10.3)
Creatinine, Ser: 2.18 mg/dL — ABNORMAL HIGH (ref 0.61–1.24)
GFR calc Af Amer: 39 mL/min — ABNORMAL LOW (ref >60)
GFR, EST NON AFRICAN AMERICAN: 33 mL/min — AB (ref >60)
GLUCOSE: 127 mg/dL — AB (ref 65–99)
POTASSIUM: 3.1 mmol/L — AB (ref 3.5–5.1)
Sodium: 142 mmol/L (ref 135–145)

## 2017-03-29 LAB — POTASSIUM: POTASSIUM: 3.2 mmol/L — AB (ref 3.5–5.1)

## 2017-03-30 LAB — POTASSIUM
Potassium: 3.3 mmol/L — ABNORMAL LOW (ref 3.5–5.1)
Potassium: 3.4 mmol/L — ABNORMAL LOW (ref 3.5–5.1)

## 2017-06-10 DEATH — deceased

## 2019-05-24 IMAGING — DX DG ABD PORTABLE 1V
1 series · 1 of 1 positions shown · non-contrast
Comparison: None.

CLINICAL DATA: Check nasogastric catheter placement

EXAM:
PORTABLE ABDOMEN - 1 VIEW

[abdomen kub]
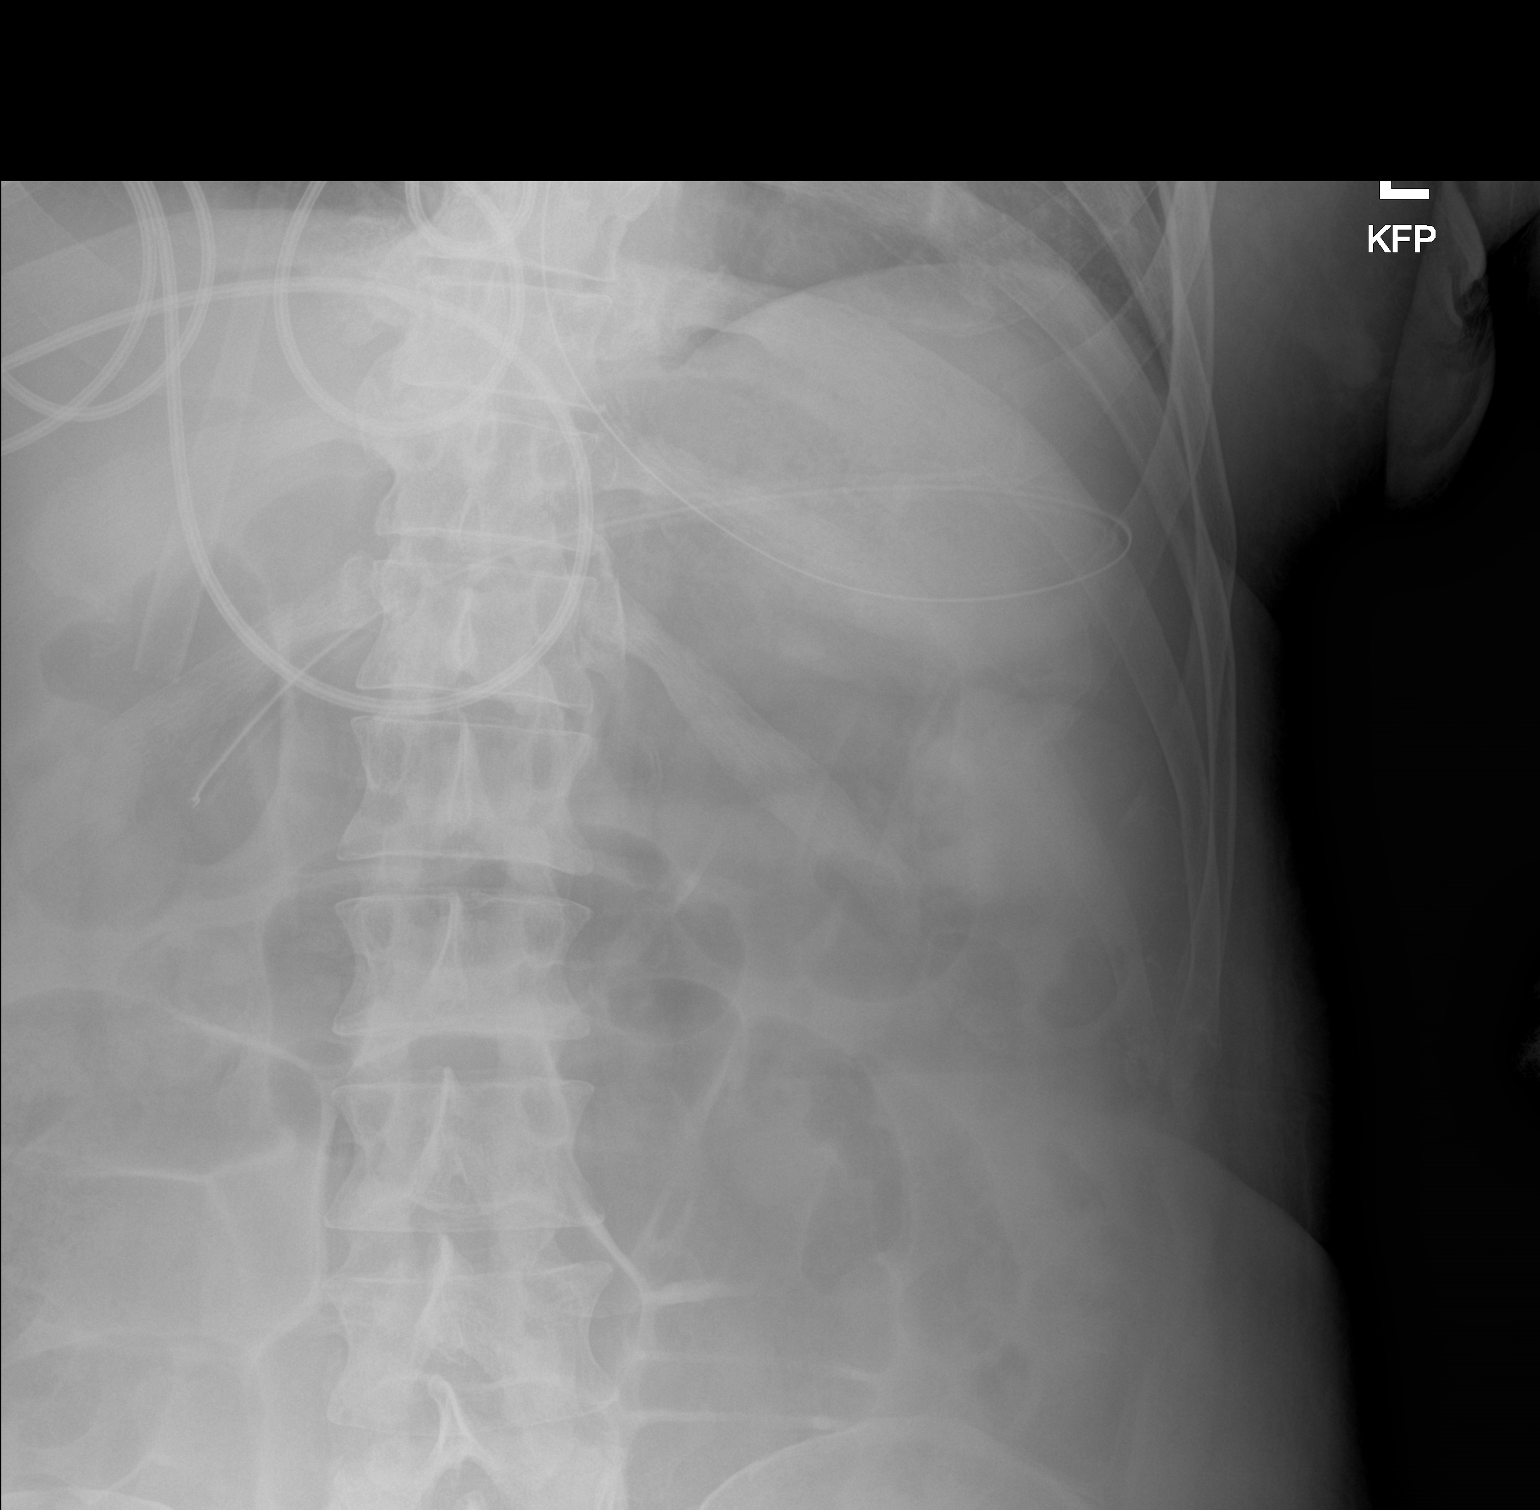

[1 of 1 positions shown; findings below may reference images not displayed]

FINDINGS: Scattered large and small bowel gas is noted. Nasogastric catheter
is noted deep within the stomach directed towards the pylorus. No
acute abnormality is noted.
IMPRESSION: Nasogastric catheter in the distal stomach as described.

## 2019-05-24 IMAGING — CR DG CHEST 1V PORT
1 series · 1 of 1 positions shown · non-contrast
Comparison: None.

CLINICAL DATA: Respiratory failure.

EXAM:
PORTABLE CHEST 1 VIEW

[AP]
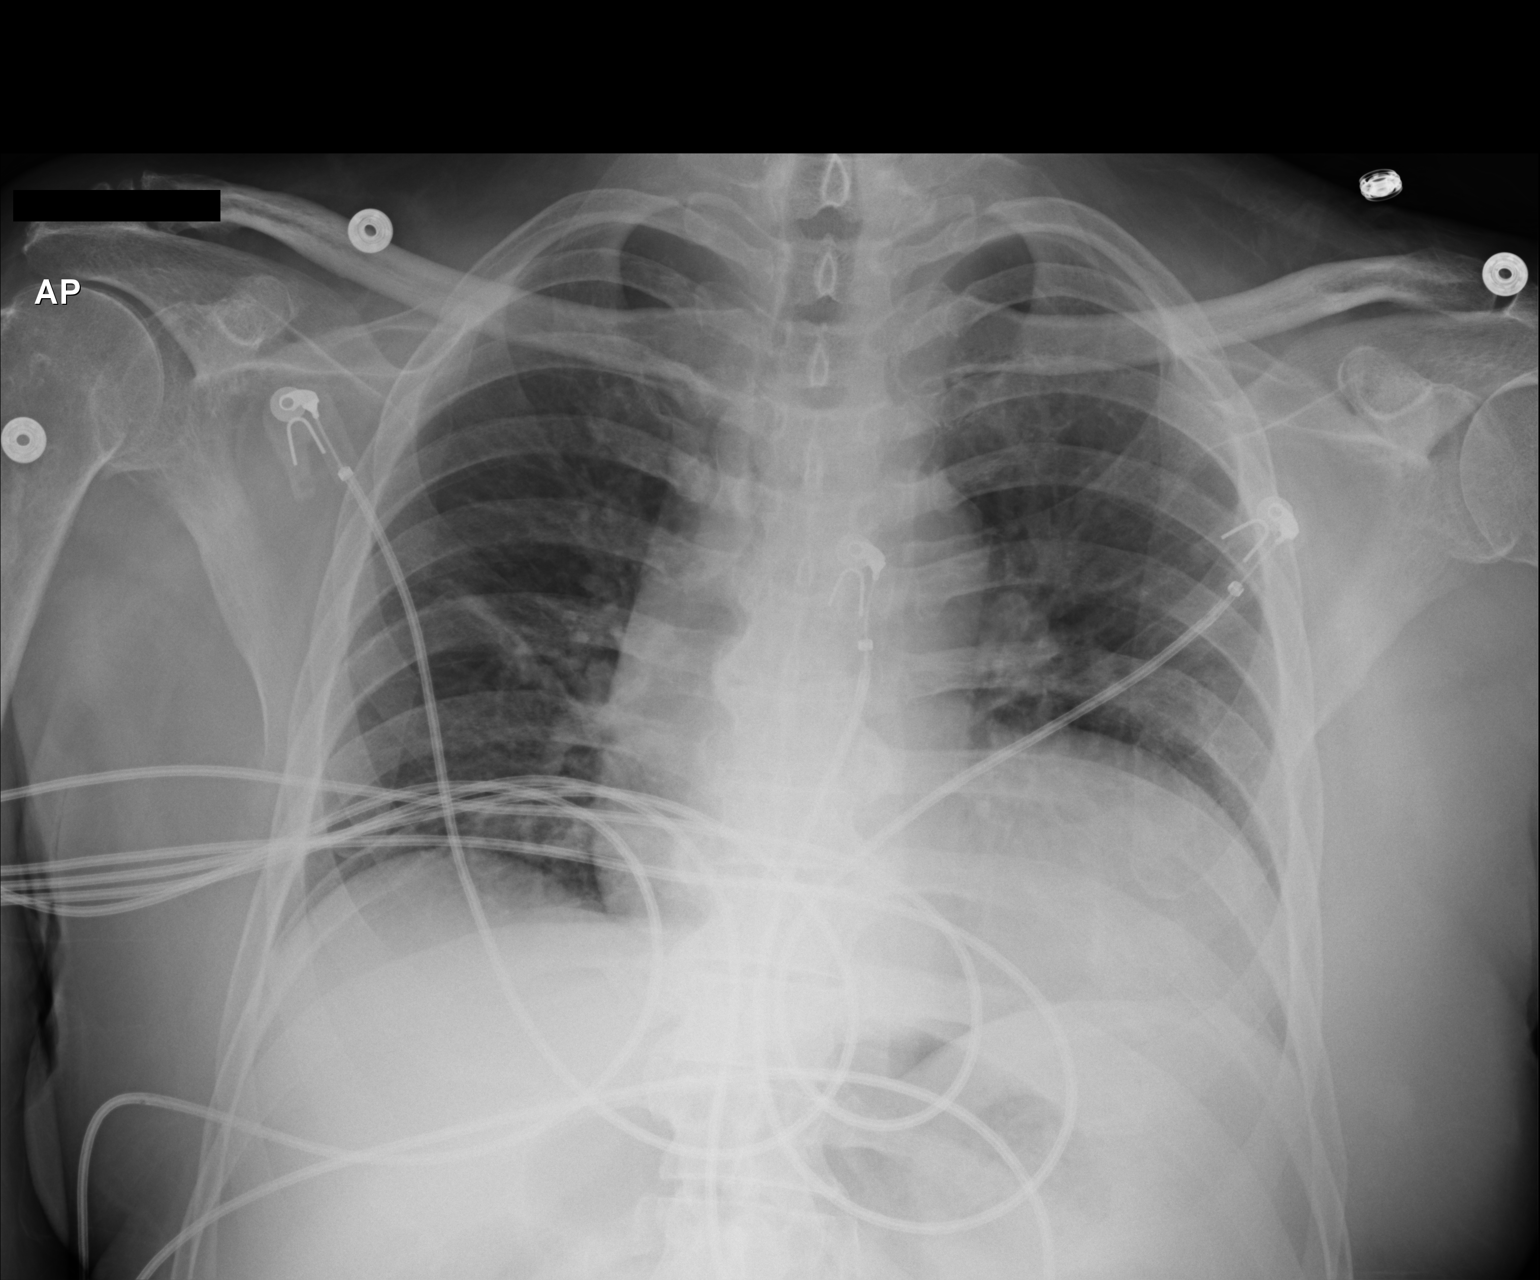

[1 of 1 positions shown; findings below may reference images not displayed]

FINDINGS: Cardiomegaly noted.

Mild atelectasis in the right mid lung and left lung base noted.

There is no evidence of focal airspace disease, pulmonary edema,
suspicious pulmonary nodule/mass, pleural effusion, or pneumothorax.

No acute bony abnormalities are identified.
IMPRESSION: Mild right mid lung and left basilar atelectasis.

Cardiomegaly.

## 2019-05-24 IMAGING — CR DG ABD PORTABLE 1V
2 series · 2 of 2 positions shown · non-contrast
Comparison: None.

CLINICAL DATA: Ileus

EXAM:
PORTABLE ABDOMEN - 1 VIEW

[AP (1 of 2)]
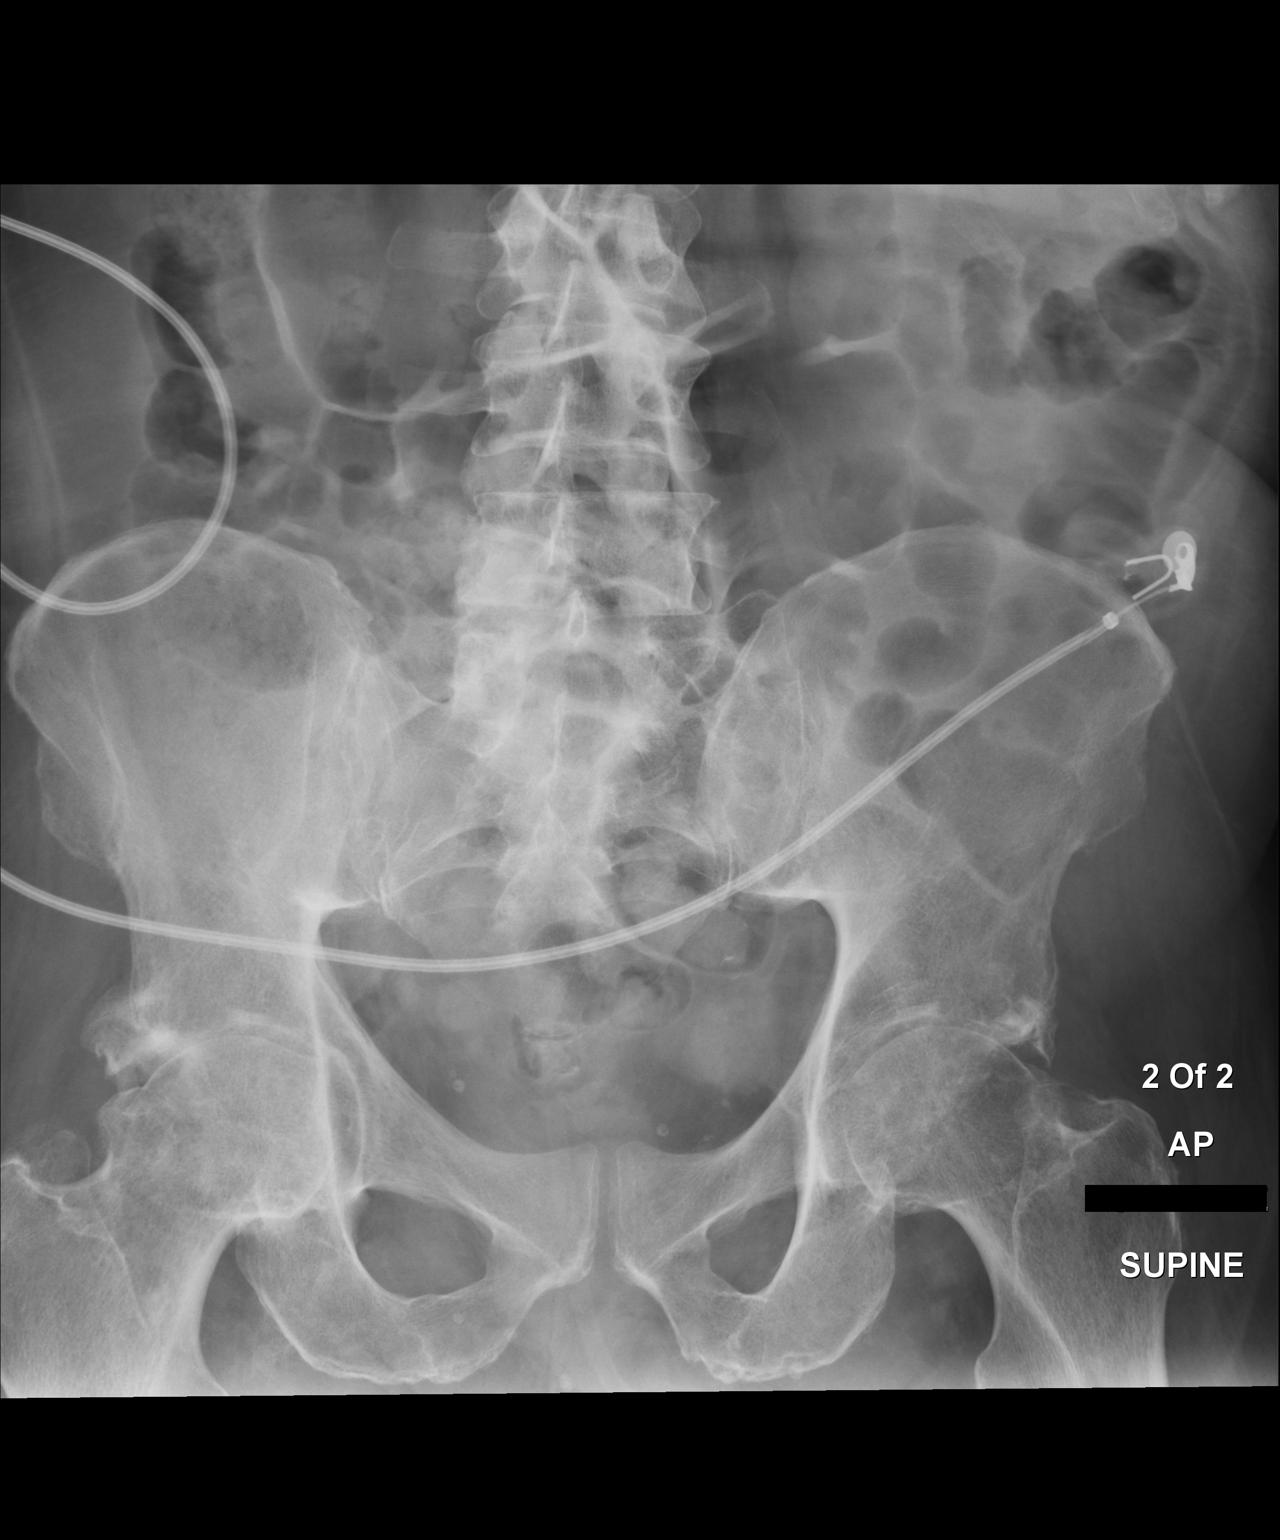

[AP (2 of 2)]
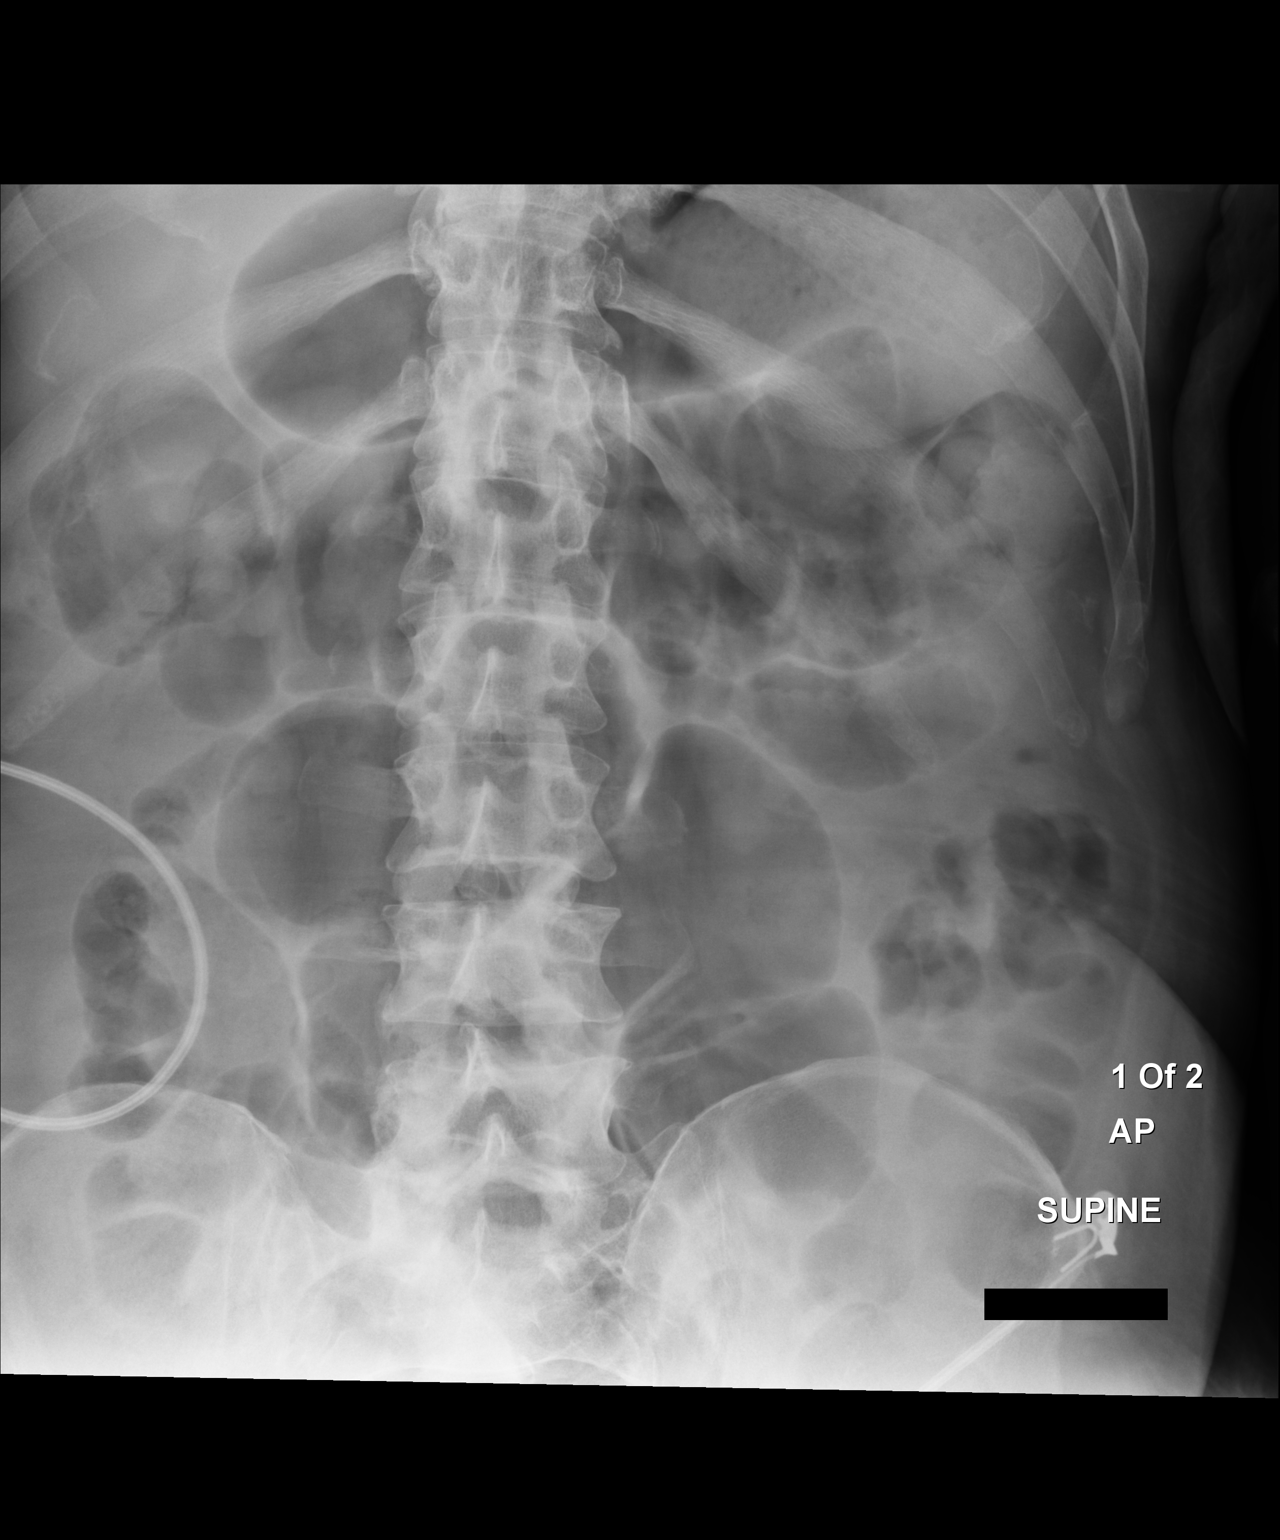

[2 of 2 positions shown; findings below may reference images not displayed]

FINDINGS: Nonobstructed, nonspecific bowel gas pattern with scattered air
containing small and large bowel loops. No bowel obstruction or free
air. Moderate colonic stool burden within the colon. There is lower
lumbar degenerative facet arthropathy at L4-5 and L5-S1 with
osteoarthritis of both hip joints characterized by joint space
narrowing and spurring about the acetabula and femoral heads.
Phleboliths are seen in the lower pelvis. No radiopaque calculi are
apparent.
IMPRESSION: Nonspecific go nondilated bowel gas pattern. No evidence of ileus or
bowel obstruction.

## 2019-05-26 IMAGING — DX DG ABD PORTABLE 1V
1 series · 1 of 1 positions shown · non-contrast
Comparison: Abdominal radiograph 02/08/2017.

CLINICAL DATA: 51-year-old male status post feeding tube placement.

EXAM:
PORTABLE ABDOMEN - 1 VIEW

[abdomen kub]
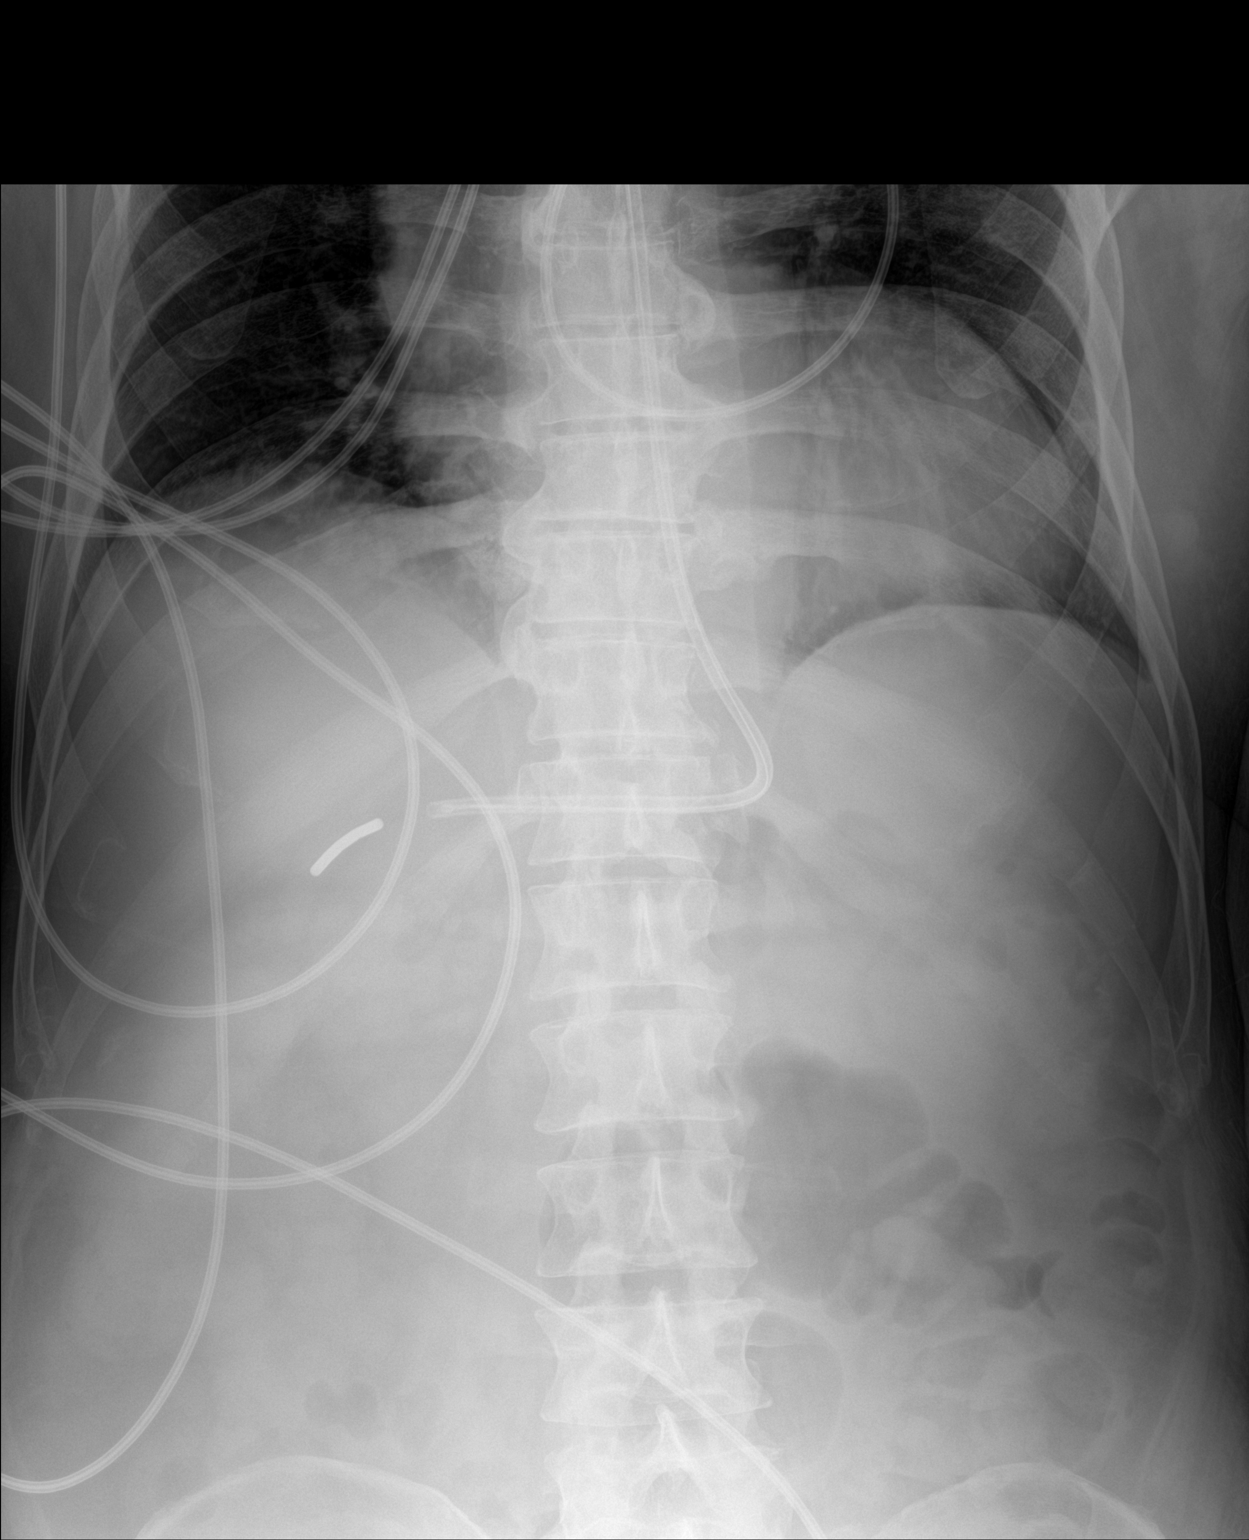

[1 of 1 positions shown; findings below may reference images not displayed]

FINDINGS: Tip of feeding tube is either in the antral pre-pyloric region of
the stomach or the duodenal bulb. Visualized bowel gas pattern is
nonobstructive.
IMPRESSION: 1. Tip of feeding tube is either in the antral pre-pyloric region of
the stomach or duodenal bulb.
